# Patient Record
Sex: Male | Born: 1935 | Race: Black or African American | Hispanic: No | Marital: Married | Smoking: Never smoker
Health system: Southern US, Community
[De-identification: ages and names within clinical notes are randomized; demographics above are authoritative.]

## PROBLEM LIST (undated history)

## (undated) DIAGNOSIS — H409 Unspecified glaucoma: Secondary | ICD-10-CM

## (undated) DIAGNOSIS — K219 Gastro-esophageal reflux disease without esophagitis: Secondary | ICD-10-CM

## (undated) DIAGNOSIS — H9190 Unspecified hearing loss, unspecified ear: Secondary | ICD-10-CM

## (undated) DIAGNOSIS — N400 Enlarged prostate without lower urinary tract symptoms: Secondary | ICD-10-CM

## (undated) DIAGNOSIS — I10 Essential (primary) hypertension: Secondary | ICD-10-CM

## (undated) DIAGNOSIS — E559 Vitamin D deficiency, unspecified: Secondary | ICD-10-CM

## (undated) DIAGNOSIS — M1990 Unspecified osteoarthritis, unspecified site: Secondary | ICD-10-CM

## (undated) HISTORY — DX: Unspecified osteoarthritis, unspecified site: M19.90

## (undated) HISTORY — DX: Unspecified hearing loss, unspecified ear: H91.90

## (undated) HISTORY — PX: CATARACT EXTRACTION: SUR2

---

## 2005-08-10 HISTORY — PX: EYE SURGERY: SHX253

## 2007-08-16 ENCOUNTER — Emergency Department (HOSPITAL_COMMUNITY): Admission: EM | Admit: 2007-08-16 | Discharge: 2007-08-16 | Payer: Self-pay | Admitting: Emergency Medicine

## 2007-09-04 ENCOUNTER — Emergency Department (HOSPITAL_COMMUNITY): Admission: EM | Admit: 2007-09-04 | Discharge: 2007-09-05 | Payer: Self-pay | Admitting: Emergency Medicine

## 2007-11-05 ENCOUNTER — Emergency Department (HOSPITAL_COMMUNITY): Admission: EM | Admit: 2007-11-05 | Discharge: 2007-11-05 | Payer: Self-pay | Admitting: Emergency Medicine

## 2007-11-07 ENCOUNTER — Ambulatory Visit: Payer: Self-pay | Admitting: *Deleted

## 2008-01-06 ENCOUNTER — Ambulatory Visit: Payer: Self-pay | Admitting: Internal Medicine

## 2008-01-06 ENCOUNTER — Encounter: Payer: Self-pay | Admitting: Family Medicine

## 2008-01-06 LAB — CONVERTED CEMR LAB
ALT: 14 units/L (ref 0–53)
Alkaline Phosphatase: 81 units/L (ref 39–117)
Basophils Relative: 0 % (ref 0–1)
Eosinophils Absolute: 0.2 10*3/uL (ref 0.0–0.7)
Glucose, Bld: 93 mg/dL (ref 70–99)
LDL Cholesterol: 127 mg/dL — ABNORMAL HIGH (ref 0–99)
MCHC: 33.9 g/dL (ref 30.0–36.0)
MCV: 87 fL (ref 78.0–100.0)
Neutrophils Relative %: 59 % (ref 43–77)
Platelets: 201 10*3/uL (ref 150–400)
Sed Rate: 21 mm/hr — ABNORMAL HIGH (ref 0–16)
Sodium: 142 meq/L (ref 135–145)
Total Bilirubin: 1.1 mg/dL (ref 0.3–1.2)
Total CHOL/HDL Ratio: 4.7
Total Protein: 9.4 g/dL — ABNORMAL HIGH (ref 6.0–8.3)
Triglycerides: 171 mg/dL — ABNORMAL HIGH (ref <150)
VLDL: 34 mg/dL (ref 0–40)

## 2011-04-30 LAB — POCT URINALYSIS DIP (DEVICE)
Hgb urine dipstick: NEGATIVE
Operator id: 235561
Protein, ur: NEGATIVE
Specific Gravity, Urine: 1.02
Urobilinogen, UA: 0.2
pH: 6

## 2011-05-01 LAB — BASIC METABOLIC PANEL
BUN: 25 — ABNORMAL HIGH
Calcium: 8.9
Creatinine, Ser: 1.23
GFR calc non Af Amer: 58 — ABNORMAL LOW
Glucose, Bld: 91
Sodium: 138

## 2011-05-01 LAB — CBC
MCHC: 34.3
Platelets: 197
RDW: 13.9

## 2011-05-01 LAB — DIFFERENTIAL
Basophils Absolute: 0
Basophils Relative: 1
Eosinophils Absolute: 0.2
Neutro Abs: 5.1
Neutrophils Relative %: 53

## 2011-05-04 LAB — DIFFERENTIAL
Basophils Absolute: 0
Eosinophils Relative: 3
Lymphocytes Relative: 36
Monocytes Absolute: 1
Monocytes Relative: 11
Neutro Abs: 4.9

## 2011-05-04 LAB — CBC
MCV: 85.7
Platelets: 211
RDW: 13.4
WBC: 9.7

## 2011-05-04 LAB — POCT CARDIAC MARKERS
CKMB, poc: 1.5
Myoglobin, poc: 75.1
Troponin i, poc: <0.05

## 2011-05-04 LAB — COMPREHENSIVE METABOLIC PANEL
ALT: 21
AST: 20
Albumin: 3.7
CO2: 26
Calcium: 9.1
Chloride: 106
Creatinine, Ser: 1.32
GFR calc non Af Amer: 53 — ABNORMAL LOW
Sodium: 142

## 2011-05-04 LAB — URINALYSIS, ROUTINE W REFLEX MICROSCOPIC
Ketones, ur: NEGATIVE
Protein, ur: NEGATIVE
Urobilinogen, UA: 0.2

## 2016-02-18 ENCOUNTER — Encounter (HOSPITAL_BASED_OUTPATIENT_CLINIC_OR_DEPARTMENT_OTHER): Payer: Self-pay | Admitting: *Deleted

## 2016-02-18 ENCOUNTER — Emergency Department (HOSPITAL_BASED_OUTPATIENT_CLINIC_OR_DEPARTMENT_OTHER)
Admission: EM | Admit: 2016-02-18 | Discharge: 2016-02-18 | Disposition: A | Discharge status: Home / Self Care | Payer: Self-pay | Attending: Emergency Medicine | Admitting: Emergency Medicine

## 2016-02-18 DIAGNOSIS — H409 Unspecified glaucoma: Secondary | ICD-10-CM | POA: Insufficient documentation

## 2016-02-18 DIAGNOSIS — Z79899 Other long term (current) drug therapy: Secondary | ICD-10-CM | POA: Insufficient documentation

## 2016-02-18 HISTORY — DX: Unspecified glaucoma: H40.9

## 2016-02-18 MED ORDER — ACETAMINOPHEN 500 MG PO TABS
1000.0000 mg | ORAL_TABLET | Freq: Once | ORAL | Status: AC
  Administered 2016-02-18: 1000 mg via ORAL
  Filled 2016-02-18: qty 2

## 2016-02-18 MED ORDER — FLUORESCEIN SODIUM 1 MG OP STRP
1.0000 | ORAL_STRIP | Freq: Once | OPHTHALMIC | Status: AC
  Administered 2016-02-18: 1 via OPHTHALMIC
  Filled 2016-02-18: qty 1

## 2016-02-18 MED ORDER — IBUPROFEN 800 MG PO TABS
800.0000 mg | ORAL_TABLET | Freq: Once | ORAL | Status: AC
  Administered 2016-02-18: 800 mg via ORAL
  Filled 2016-02-18: qty 1

## 2016-02-18 MED ORDER — TETRACAINE HCL 0.5 % OP SOLN
2.0000 [drp] | Freq: Once | OPHTHALMIC | Status: AC
  Administered 2016-02-18: 2 [drp] via OPHTHALMIC
  Filled 2016-02-18: qty 4

## 2016-02-18 MED ORDER — OXYCODONE HCL 5 MG PO TABS: 5.0000 mg | ORAL_TABLET | ORAL | Status: DC | PRN

## 2016-02-18 MED ORDER — POLYVINYL ALCOHOL-POVIDONE 5-6 MG/ML OP SOLN: 2.0000 [drp] | Freq: Two times a day (BID) | OPHTHALMIC | Status: DC

## 2016-02-18 MED ORDER — OXYCODONE HCL 5 MG PO TABS
5.0000 mg | ORAL_TABLET | Freq: Once | ORAL | Status: AC
  Administered 2016-02-18: 5 mg via ORAL
  Filled 2016-02-18: qty 1

## 2016-02-18 MED FILL — oxyCODONE HCL 5 MG TABS: 5 | 2 days supply | Qty: 5 | Fill #0

## 2016-02-18 NOTE — ED Notes (Signed)
Fish farm manager phone used

## 2016-02-18 NOTE — ED Provider Notes (Signed)
CSN: [id number]     Arrival date & time 02/18/16  1634 History   First MD Initiated Contact with Patient 02/18/16 1641     Chief Complaint  Patient presents with  . Eye Pain     (Consider location/radiation/quality/duration/timing/severity/associated sxs/prior Treatment) Patient is a 82 y.o. male presenting with eye pain. The history is provided by the patient. The history is limited by a language barrier. A language interpreter was used.  Eye Pain This is a chronic problem. The current episode started more than 1 week ago (about 6 months ago). The problem occurs constantly. The problem has not changed since onset.Pertinent negatives include no chest pain, no abdominal pain, no headaches and no shortness of breath. Nothing aggravates the symptoms. Nothing relieves the symptoms. He has tried nothing for the symptoms. The treatment provided no relief.   82 yo M With a chief complaint of left eye pain. Patient is also not had vision in this eye for at least 6 months. His symptoms have gotten slightly better after he saw an ophthalmologist and was started on atenolol and acetylzolamide and predforte drops.   However over the past 3 weeks or so his symptoms gotten worse. Is now having some much pain that he has trouble sleeping. Patient does not feel that he can see anything out of the left eye. Denies injury. Denies noncompliance.  Past Medical History  Diagnosis Date  . Glaucoma    Past Surgical History  Procedure Laterality Date  . Eye surgery    . Cataract extraction     History reviewed. No pertinent family history. Social History  Substance Use Topics  . Smoking status: Never Smoker   . Smokeless tobacco: None  . Alcohol Use: No    Review of Systems  Constitutional: Negative for fever and chills.  HENT: Negative for congestion and facial swelling.   Eyes: Positive for pain, redness and visual disturbance. Negative for discharge.  Respiratory: Negative for shortness of breath.    Cardiovascular: Negative for chest pain and palpitations.  Gastrointestinal: Negative for vomiting, abdominal pain and diarrhea.  Musculoskeletal: Negative for myalgias and arthralgias.  Skin: Negative for color change and rash.  Neurological: Negative for tremors, syncope and headaches.  Psychiatric/Behavioral: Negative for confusion and dysphoric mood.      Allergies  Review of patient's allergies indicates no known allergies.  Home Medications   Prior to Admission medications   Medication Sig Start Date End Date Taking? Authorizing Provider  brimonidine (ALPHAGAN) 0.2 % ophthalmic solution 3 (three) times daily.   Yes Historical Provider, MD  prednisoLONE acetate (PRED FORTE) 1 % ophthalmic suspension 1 drop 4 (four) times daily.   Yes Historical Provider, MD  timolol (BETIMOL) 0.5 % ophthalmic solution 1 drop 2 (two) times daily.   Yes Historical Provider, MD  Travoprost, BAK Free, (TRAVATAN) 0.004 % SOLN ophthalmic solution 1 drop at bedtime.   Yes Historical Provider, MD  oxyCODONE (ROXICODONE) 5 MG immediate release tablet Take 1 tablet (5 mg total) by mouth every 4 (four) hours as needed for severe pain. 02/18/16   Deno Etienne, DO  Polyvinyl Alcohol-Povidone (CLEAR EYES NATURAL TEARS) 5-6 MG/ML SOLN Apply 2 drops to eye 2 (two) times daily. 02/18/16   Deno Etienne, DO   BP 160/81 mmHg  Pulse 58  Resp 16  Ht 6\' 5"  (1.956 m)  Wt 210 lb (95.255 kg)  BMI 24.90 kg/m2  SpO2 99% Physical Exam  Constitutional: He is oriented to person, place, and time. He appears  well-developed and well-nourished.  HENT:  Head: Normocephalic and atraumatic.  Eyes: EOM are normal. Pupils are equal, round, and reactive to light. Left eye exhibits no chemosis, no discharge and no exudate. Left conjunctiva is injected.  Hazy appearing cornea with nonreactive pupil. Pupil is mid fixed and dilated.  tonopen pressure 54 and repeat 53.   Neck: Normal range of motion. Neck supple. No JVD present.   Cardiovascular: Normal rate and regular rhythm.  Exam reveals no gallop and no friction rub.   No murmur heard. Pulmonary/Chest: No respiratory distress. He has no wheezes.  Abdominal: He exhibits no distension. There is no rebound and no guarding.  Musculoskeletal: Normal range of motion.  Neurological: He is alert and oriented to person, place, and time.  Skin: No rash noted. No pallor.  Psychiatric: He has a normal mood and affect. His behavior is normal.  Nursing note and vitals reviewed.   ED Course  Procedures (including critical care time) Labs Review Labs Reviewed - No data to display  Imaging Review No results found. I have personally reviewed and evaluated these images and lab results as part of my medical decision-making.   EKG Interpretation None      MDM   Final diagnoses:  Glaucoma    82 yo M with a chief complaint of left eye pain. Patient has a complicated ophthalmologic history. Discussed with the ophthalmologist on-call, Dr. Arlina Robes,  she recommended continuing his current drops and she will see him in the office in the morning. With the chronicity of pain feel unlikely to be acute angle closure glaucoma.   6:46 PM:  I have discussed the diagnosis/risks/treatment options with the patient and family and believe the pt to be eligible for discharge home to follow-up with Ophto. We also discussed returning to the ED immediately if new or worsening sx occur. We discussed the sx which are most concerning (e.g., sudden worsening pain, fever, inability to tolerate by mouth) that necessitate immediate return. Medications administered to the patient during their visit and any new prescriptions provided to the patient are listed below.  Medications given during this visit Medications  fluorescein ophthalmic strip 1 strip (1 strip Both Eyes Given 02/18/16 1656)  tetracaine (PONTOCAINE) 0.5 % ophthalmic solution 2 drop (2 drops Both Eyes Given 02/18/16 1656)  acetaminophen  (TYLENOL) tablet 1,000 mg (1,000 mg Oral Given 02/18/16 1719)  ibuprofen (ADVIL,MOTRIN) tablet 800 mg (800 mg Oral Given 02/18/16 1719)  oxyCODONE (Oxy IR/ROXICODONE) immediate release tablet 5 mg (5 mg Oral Given 02/18/16 1719)    Discharge Medication List as of 02/18/2016  5:29 PM    START taking these medications   Details  oxyCODONE (ROXICODONE) 5 MG immediate release tablet Take 1 tablet (5 mg total) by mouth every 4 (four) hours as needed for severe pain., Starting 02/18/2016, Until Discontinued, Print    Polyvinyl Alcohol-Povidone (CLEAR EYES NATURAL TEARS) 5-6 MG/ML SOLN Apply 2 drops to eye 2 (two) times daily., Starting 02/18/2016, Until Discontinued, Print        The patient appears reasonably screen and/or stabilized for discharge and I doubt any other medical condition or other [facility] requiring further screening, evaluation, or treatment in the ED at this time prior to discharge.    Deno Etienne, DO 02/18/16 [phone]

## 2016-02-18 NOTE — ED Notes (Signed)
MD at bedside. 

## 2016-02-18 NOTE — ED Notes (Signed)
Pt c/o left eye pain and no vision x 3 weeks

## 2016-02-18 NOTE — Discharge Instructions (Signed)
See the eye doctor in the morning! Glaucoma Glaucoma is a condition that is caused by high pressure inside the eye. The pressure in the eye is raised because fluid (aqueous humor) within the eye cannot get out through the normal drainage system. The increased pressure can cause damage to the nerves of the eye, and that can result in loss of vision. It is important to diagnose glaucoma early before damage occurs. Early treatment can often prevent vision loss. There are two main types of glaucoma:  Open-angle glaucoma. This is a chronic condition that causes the pressure inside the eye to rise slowly. This is the most common type of glaucoma, and it often causes no symptoms until later in the disease.  Acute angle-closure glaucoma. With this type, the pressure inside the eye rises suddenly to a very high level. This causes severe pain and requires treatment right away. CAUSES In many cases, the cause of this condition is not known. Glaucoma can sometimes result from other diseases, such as infection, cataracts, or tumors.  RISK FACTORS This condition is more likely to develop in people who:  Are older than 80 years of age.  Are of African-American descent.  Have high blood pressure or diabetes.  Have a family history of glaucoma.  Have a previous eye injury or eye surgery.  Are farsighted.  Use certain medicines that increase pressure in the eyes or cause the pupils to widen (dilate). SYMPTOMS In many cases, open-angle glaucoma causes no symptoms early on. If it is not treated, the condition will get worse and may cause a loss of side vision (peripheral vision). This can advance to tunnel vision, which means that you are able to see straight ahead but you have a loss of peripheral vision in all directions. Eventually, total loss of vision can occur. Symptoms of acute angle-closure glaucoma develop suddenly and may include:  Clouded vision.  Severe pain in your affected eye.  Severe  headache in the area around your eye.  Feeling nauseous.  Vomiting. DIAGNOSIS Glaucoma is diagnosed through an eye exam by an eye specialist (ophthalmologist). This specialist will perform a test to measure the pressure in your eye (tonometry). Eye tests to check your vision-- especially your peripheral vision--will be done. The ophthalmologist will also use various instruments to look inside your eye and check for damage to the nerves. Because glaucoma often does not cause symptoms until late in the disease, it is often found during a routine eye exam. It is important to have your eyes checked regularly. TREATMENT Early treatment is important to prevent vision loss. Treatment will focus on lowering the pressure in your eyes. This usually involves using medicines in the form of eye drops. The type of medicine will depend on how bad the disease is and the degree of damage. Laser treatments or other types of surgery may be needed in some cases.  Acute angle-closure glaucoma requires emergency treatment to help prevent vision loss. This treatment may involve eye drops and medicines that are given in pill form or through an IV tube. Surgery is usually done on both eyes right away. HOME CARE INSTRUCTIONS  Take medicines only as directed by your health care provider. Use your eye drops exactly as instructed.It is likely that you will need to use this medicine for the rest of your life.  Get regular exercise, but talk with your health care provider about which types of exercise are safe for you. You may need to avoid yoga or other types  of exercise that may involve standing on your head.  Keep all follow-up visits as directed by your health care provider. This is important. SEEK MEDICAL CARE IF:  You have new or worsening symptoms. SEEK IMMEDIATE MEDICAL CARE IF:  You have severe pain in your affected eye.  You have problems with your vision.  You have a bad headache in the area around your  eye.  You develop nausea and vomiting.  You have the same or similar symptoms in your other eye.   This information is not intended to replace advice given to you by your health care provider. Make sure you discuss any questions you have with your health care provider.   Document Released: 07/27/2005 Document Revised: 08/17/2014 Document Reviewed: 05/08/2014 Elsevier Interactive Patient Education [employer].

## 2016-04-02 ENCOUNTER — Ambulatory Visit (HOSPITAL_COMMUNITY)
Admission: EM | Admit: 2016-04-02 | Discharge: 2016-04-02 | Disposition: A | Discharge status: Home / Self Care | Payer: Self-pay | Attending: Emergency Medicine | Admitting: Emergency Medicine

## 2016-04-02 ENCOUNTER — Encounter (HOSPITAL_COMMUNITY): Payer: Self-pay | Admitting: Emergency Medicine

## 2016-04-02 DIAGNOSIS — H6123 Impacted cerumen, bilateral: Secondary | ICD-10-CM

## 2016-04-02 NOTE — ED Triage Notes (Signed)
Via pt's daughter who's serving as Arabic interpreter  Pt reports recurrent of bilateral ear cerumen impaction.   Voices no other concerns.... A&O x4... NAD

## 2016-04-02 NOTE — ED Provider Notes (Signed)
HPI  SUBJECTIVE:  James Ayers is a 82 y.o. male who presents with decreased hearing in both ears. This is been going on for an unknown period of time. Daughter states that the saw an audiologist/hearing a specialist earlier today, the patient was thought to have bilateral cerumen impaction. States that they were unable to perform the audiology test due to this. They were then referred here. They have tried irrigation and Debrox at home. There are no other aggravating or alleviating factors. He denies nausea, vomiting, fevers, ear pain, otorrhea. No foreign body insertion. Patient wears hearing aids. Daughter states that the patient requires cerumen disimpaction every 2-3 months. Patient has moved here permanently recently. PMD: [facility]- cannot remember MD's name. History obtained through daughter acting as Optometrist.     Past Medical History:  Diagnosis Date  . Glaucoma     Past Surgical History:  Procedure Laterality Date  . CATARACT EXTRACTION    . EYE SURGERY      History reviewed. No pertinent family history.  Social History  Substance Use Topics  . Smoking status: Never Smoker  . Smokeless tobacco: Never Used  . Alcohol use No    No current facility-administered medications for this encounter.   Current Outpatient Prescriptions:  .  brimonidine (ALPHAGAN) 0.2 % ophthalmic solution, 3 (three) times daily., Disp: , Rfl:  .  oxyCODONE (ROXICODONE) 5 MG immediate release tablet, Take 1 tablet (5 mg total) by mouth every 4 (four) hours as needed for severe pain., Disp: 5 tablet, Rfl: 0 .  Polyvinyl Alcohol-Povidone (CLEAR EYES NATURAL TEARS) 5-6 MG/ML SOLN, Apply 2 drops to eye 2 (two) times daily., Disp: 30 mL, Rfl: 0 .  prednisoLONE acetate (PRED FORTE) 1 % ophthalmic suspension, 1 drop 4 (four) times daily., Disp: , Rfl:  .  timolol (BETIMOL) 0.5 % ophthalmic solution, 1 drop 2 (two) times daily., Disp: , Rfl:  .  Travoprost, BAK Free, (TRAVATAN) 0.004 % SOLN  ophthalmic solution, 1 drop at bedtime., Disp: , Rfl:   No Known Allergies   ROS  As noted in HPI.   Physical Exam  BP 162/73 (BP Location: Left Arm)   Pulse 68   Temp 98.6 F (37 C) (Oral)   Resp 18   SpO2 100%   BP Readings from Last 3 Encounters:  04/02/16 162/73  02/18/16 160/81   Constitutional: Well developed, well nourished, no acute distress Eyes:  EOMI, conjunctiva normal bilaterally HENT: Normocephalic, atraumatic,mucus membranes moist. Right ear completely occluded with cerumen. Mild cerumen buildup in the left ear, but able to visualize TM, which appears normal. Hearing grossly diminished bilaterally. Respiratory: Normal inspiratory effort Cardiovascular: Normal rate GI: nondistended skin: No rash, skin intact Musculoskeletal: no deformities Neurologic: Alert & oriented x 3, no focal neuro deficits Psychiatric: Speech and behavior appropriate   ED Course   Medications - No data to display  Orders Placed This Encounter  Procedures  . Ear wax removal    Standing Status:   Standing    Number of Occurrences:   1    No results found for this or any previous visit (from the past 24 hour(s)). No results found.  ED Clinical Impression  Cerumen impaction, bilateral   ED Assessment/Plan  On reevaluation, bilateral TMs normal. Bilateral external ear canals normal. No Evidence of trauma to TM, otitis media, or otitis externa. No pain with traction on the right pinna. Hearing still diminished, but improved per pt. Hearing grossly equal bilaterally.  Advised follow-up with  audiologist. If hearing still diminished, we'll refer to Dr. Lucia Gaskins, ENT on call.   Discussed MDM, plan and followup with patient  Discussed sn/sx that should prompt return to the ED. family agrees with plan.   *This clinic note was created using Dragon dictation software. Therefore, there may be occasional mistakes despite careful proofreading.  ?   Melynda Ripple, MD 04/02/16  2135

## 2016-04-02 NOTE — Discharge Instructions (Signed)
Try some Debrox to help prevent wax buildup in the future.

## 2016-06-18 ENCOUNTER — Emergency Department (HOSPITAL_COMMUNITY): Payer: Self-pay

## 2016-06-18 ENCOUNTER — Encounter (HOSPITAL_COMMUNITY): Payer: Self-pay

## 2016-06-18 ENCOUNTER — Emergency Department (HOSPITAL_COMMUNITY)
Admission: EM | Admit: 2016-06-18 | Discharge: 2016-06-18 | Disposition: A | Discharge status: Home / Self Care | Payer: Self-pay | Attending: Emergency Medicine | Admitting: Emergency Medicine

## 2016-06-18 DIAGNOSIS — M545 Low back pain: Secondary | ICD-10-CM | POA: Insufficient documentation

## 2016-06-18 DIAGNOSIS — Y939 Activity, unspecified: Secondary | ICD-10-CM | POA: Insufficient documentation

## 2016-06-18 DIAGNOSIS — Z7982 Long term (current) use of aspirin: Secondary | ICD-10-CM | POA: Insufficient documentation

## 2016-06-18 DIAGNOSIS — R102 Pelvic and perineal pain: Secondary | ICD-10-CM | POA: Insufficient documentation

## 2016-06-18 DIAGNOSIS — W010XXA Fall on same level from slipping, tripping and stumbling without subsequent striking against object, initial encounter: Secondary | ICD-10-CM | POA: Insufficient documentation

## 2016-06-18 DIAGNOSIS — Y999 Unspecified external cause status: Secondary | ICD-10-CM | POA: Insufficient documentation

## 2016-06-18 DIAGNOSIS — W19XXXA Unspecified fall, initial encounter: Secondary | ICD-10-CM

## 2016-06-18 DIAGNOSIS — Y9289 Other specified places as the place of occurrence of the external cause: Secondary | ICD-10-CM | POA: Insufficient documentation

## 2016-06-18 MED ORDER — OXYCODONE HCL 5 MG PO TABS: 5.0000 mg | ORAL_TABLET | Freq: Four times a day (QID) | ORAL | 0 refills | Status: DC | PRN

## 2016-06-18 MED ORDER — METHOCARBAMOL 500 MG PO TABS
500.0000 mg | ORAL_TABLET | Freq: Once | ORAL | Status: AC
  Administered 2016-06-18: 500 mg via ORAL
  Filled 2016-06-18: qty 1

## 2016-06-18 MED ORDER — IBUPROFEN 400 MG PO TABS
400.0000 mg | ORAL_TABLET | Freq: Once | ORAL | Status: AC
  Administered 2016-06-18: 400 mg via ORAL
  Filled 2016-06-18: qty 1

## 2016-06-18 MED ORDER — METHOCARBAMOL 500 MG PO TABS: 500.0000 mg | ORAL_TABLET | Freq: Two times a day (BID) | ORAL | 0 refills | Status: DC | PRN

## 2016-06-18 MED ORDER — IBUPROFEN 400 MG PO TABS: 400.0000 mg | ORAL_TABLET | Freq: Four times a day (QID) | ORAL | 0 refills | Status: DC | PRN

## 2016-06-18 NOTE — ED Triage Notes (Signed)
Pt slipped and fell yesterday while outside. Pt reports pain to lower back and hips. Denies head injury because his son states he held his head. Denies LOC.

## 2016-06-20 NOTE — ED Provider Notes (Signed)
[facility] DEPT Provider Note   CSN: [id number] Arrival date & time: 06/18/16  1544     History   Chief Complaint Chief Complaint  Patient presents with  . Fall    HPI Dvonta Caltrider is a 82 y.o. male.   Fall  This is a new problem. The current episode started yesterday. The problem occurs rarely. The problem has been resolved. The symptoms are relieved by NSAIDs.    Past Medical History:  Diagnosis Date  . Glaucoma     There are no active problems to display for this patient.   Past Surgical History:  Procedure Laterality Date  . CATARACT EXTRACTION    . EYE SURGERY         Home Medications    Prior to Admission medications   Medication Sig Start Date End Date Taking? Authorizing Provider  aspirin 81 MG chewable tablet Chew 81 mg by mouth daily.   Yes Historical Provider, MD  brimonidine (ALPHAGAN) 0.2 % ophthalmic solution Place 1 drop into both eyes 3 (three) times daily.    Yes Historical Provider, MD  Polyvinyl Alcohol-Povidone (CLEAR EYES NATURAL TEARS) 5-6 MG/ML SOLN Apply 2 drops to eye 2 (two) times daily. Patient taking differently: Place 2 drops into both eyes 2 (two) times daily.  02/18/16  Yes Deno Etienne, DO  prednisoLONE acetate (PRED FORTE) 1 % ophthalmic suspension Place 1 drop into both eyes 4 (four) times daily.    Yes Historical Provider, MD  timolol (BETIMOL) 0.5 % ophthalmic solution Place 1 drop into both eyes 2 (two) times daily.    Yes Historical Provider, MD  Travoprost, BAK Free, (TRAVATAN) 0.004 % SOLN ophthalmic solution Place 1 drop into both eyes at bedtime.    Yes Historical Provider, MD  ibuprofen (ADVIL,MOTRIN) 400 MG tablet Take 1 tablet (400 mg total) by mouth every 6 (six) hours as needed. 06/18/16   Merrily Pew, MD  methocarbamol (ROBAXIN) 500 MG tablet Take 1 tablet (500 mg total) by mouth 2 (two) times daily as needed for muscle spasms. 06/18/16   Merrily Pew, MD  oxyCODONE (ROXICODONE) 5 MG immediate release tablet Take 1  tablet (5 mg total) by mouth every 6 (six) hours as needed for severe pain. 06/18/16   Merrily Pew, MD    Family History No family history on file.  Social History Social History  Substance Use Topics  . Smoking status: Never Smoker  . Smokeless tobacco: Never Used  . Alcohol use No     Allergies   Patient has no known allergies.   Review of Systems Review of Systems  Musculoskeletal: Positive for back pain. Negative for gait problem and neck pain.       Left sided pelvis pain mostly.   Skin: Negative for color change.  All other systems reviewed and are negative.    Physical Exam Updated Vital Signs BP 160/63 (BP Location: Right Arm)   Pulse 63   Temp 98 F (36.7 C)   Resp 16   SpO2 100%   Physical Exam  Constitutional: He is oriented to person, place, and time. He appears well-developed and well-nourished.  HENT:  Head: Normocephalic and atraumatic.  Eyes: Conjunctivae and EOM are normal.  Neck: Normal range of motion. No thyromegaly present.  Cardiovascular: Normal rate.   Pulmonary/Chest: Effort normal. No respiratory distress.  Abdominal: Soft. He exhibits no distension.  Musculoskeletal: Normal range of motion. He exhibits tenderness.  No cervical spine tenderness, thoracic spine tenderness or Lumbar spine tenderness.  No tenderness or pain with palpation and full ROM of all joints in upper and lower extremities.  No ecchymosis or other signs of trauma on back or extremities but some point ttp over left pelvis area.  No Pain with AP or lateral compression of ribs.  No Paracervical ttp, but does have mild lumbar paraspinal ttp However is able to ambulate without difficulty  Neurological: He is alert and oriented to person, place, and time.  Skin: Skin is warm and dry.  Nursing note and vitals reviewed.    ED Treatments / Results  Labs (all labs ordered are listed, but only abnormal results are displayed) Labs Reviewed - No data to display  EKG   EKG Interpretation None       Radiology Dg Thoracic Spine 2 View  Result Date: 06/18/2016 CLINICAL DATA:  Fall down steps yesterday. Thoracic back pain. Initial encounter. EXAM: THORACIC SPINE 2 VIEWS COMPARISON:  None. FINDINGS: There is no evidence of thoracic spine fracture. Alignment is normal. Mild thoracic vertebral osteophytosis is seen at all levels, without significant disc space narrowing. IMPRESSION: No acute findings. Electronically Signed   By: Earle Gell M.D.   On: 06/18/2016 17:17   Dg Lumbar Spine Complete  Result Date: 06/18/2016 CLINICAL DATA:  Fall down steps yesterday. Low back injury and pain. Initial encounter. EXAM: LUMBAR SPINE - COMPLETE 4+ VIEW COMPARISON:  None. FINDINGS: There is no evidence of lumbar spine fracture. Alignment is normal. Intervertebral disc spaces are maintained. Syndesmophyte formation is seen throughout the thoracic spine. No other significant osseous abnormality identified. IMPRESSION: No acute findings. Electronically Signed   By: Earle Gell M.D.   On: 06/18/2016 17:17   Dg Pelvis 1-2 Views  Result Date: 06/18/2016 CLINICAL DATA:  Fall down steps yesterday. Pelvic and buttock pain. Initial encounter. EXAM: PELVIS - 1-2 VIEW COMPARISON:  None. FINDINGS: There is no evidence of pelvic fracture or diastasis. No pelvic bone lesions are seen. Pelvic phleboliths and iliac vascular calcification incidentally noted. IMPRESSION: Negative. Electronically Signed   By: Earle Gell M.D.   On: 06/18/2016 17:15    Procedures Procedures (including critical care time)  Medications Ordered in ED Medications  methocarbamol (ROBAXIN) tablet 500 mg (500 mg Oral Given 06/18/16 1756)  ibuprofen (ADVIL,MOTRIN) tablet 400 mg (400 mg Oral Given 06/18/16 1756)     Initial Impression / Assessment and Plan / ED Course  I have reviewed the triage vital signs and the nursing notes.  Pertinent labs & imaging results that were available during my care of the patient  were reviewed by me and considered in my medical decision making (see chart for details).  Clinical Course     Low suspicion for a bony injury at this time. Likely muscular in nature and will treat as such. If symptoms not improving in a couple days we need to follow up with his primary doctor for further work up.  Final Clinical Impressions(s) / ED Diagnoses   Final diagnoses:  Fall, initial encounter  Pain in pelvis    New Prescriptions Discharge Medication List as of 06/18/2016  5:54 PM    START taking these medications   Details  ibuprofen (ADVIL,MOTRIN) 400 MG tablet Take 1 tablet (400 mg total) by mouth every 6 (six) hours as needed., Starting Thu 06/18/2016, Print    methocarbamol (ROBAXIN) 500 MG tablet Take 1 tablet (500 mg total) by mouth 2 (two) times daily as needed for muscle spasms., Starting Thu 06/18/2016, Print  Merrily Pew, MD 06/20/16 1043

## 2016-08-25 ENCOUNTER — Encounter (INDEPENDENT_AMBULATORY_CARE_PROVIDER_SITE_OTHER): Payer: Self-pay

## 2016-08-25 ENCOUNTER — Encounter: Payer: Self-pay | Admitting: Internal Medicine

## 2016-08-25 ENCOUNTER — Ambulatory Visit: Payer: Self-pay | Attending: Internal Medicine | Admitting: Internal Medicine

## 2016-08-25 VITALS — BP 109/67 | HR 65 | Temp 97.8°F | Resp 16 | Wt 192.2 lb

## 2016-08-25 DIAGNOSIS — E559 Vitamin D deficiency, unspecified: Secondary | ICD-10-CM | POA: Insufficient documentation

## 2016-08-25 DIAGNOSIS — I10 Essential (primary) hypertension: Secondary | ICD-10-CM | POA: Insufficient documentation

## 2016-08-25 DIAGNOSIS — N401 Enlarged prostate with lower urinary tract symptoms: Secondary | ICD-10-CM | POA: Insufficient documentation

## 2016-08-25 DIAGNOSIS — Z23 Encounter for immunization: Secondary | ICD-10-CM

## 2016-08-25 DIAGNOSIS — G8929 Other chronic pain: Secondary | ICD-10-CM | POA: Insufficient documentation

## 2016-08-25 DIAGNOSIS — R42 Dizziness and giddiness: Secondary | ICD-10-CM | POA: Insufficient documentation

## 2016-08-25 DIAGNOSIS — Z131 Encounter for screening for diabetes mellitus: Secondary | ICD-10-CM

## 2016-08-25 DIAGNOSIS — M545 Low back pain, unspecified: Secondary | ICD-10-CM | POA: Insufficient documentation

## 2016-08-25 DIAGNOSIS — Z1329 Encounter for screening for other suspected endocrine disorder: Secondary | ICD-10-CM

## 2016-08-25 LAB — CBC
HCT: 44.4 % (ref 38.5–50.0)
HEMOGLOBIN: 15 g/dL (ref 13.2–17.1)
MCH: 30.1 pg (ref 27.0–33.0)
MCHC: 33.8 g/dL (ref 32.0–36.0)
MCV: 89 fL (ref 80.0–100.0)
MPV: 9.9 fL (ref 7.5–12.5)
Platelets: 220 10*3/uL (ref 140–400)
RBC: 4.99 MIL/uL (ref 4.20–5.80)
RDW: 14.4 % (ref 11.0–15.0)
WBC: 9.2 10*3/uL (ref 3.8–10.8)

## 2016-08-25 LAB — POCT URINALYSIS DIPSTICK
Blood, UA: NEGATIVE
Glucose, UA: NEGATIVE
LEUKOCYTES UA: NEGATIVE
NITRITE UA: NEGATIVE
PROTEIN UA: 100
Spec Grav, UA: 1.025
Urobilinogen, UA: 0.2
pH, UA: 5.5

## 2016-08-25 LAB — TSH: TSH: 0.04 m[IU]/L — AB (ref 0.40–4.50)

## 2016-08-25 LAB — POCT GLYCOSYLATED HEMOGLOBIN (HGB A1C): Hemoglobin A1C: 5.7

## 2016-08-25 NOTE — Patient Instructions (Addendum)
Health Maintenance, Male A healthy lifestyle and preventative care can promote health and wellness.  Maintain regular health, dental, and eye exams.  Eat a healthy diet. Foods like vegetables, fruits, whole grains, low-fat dairy products, and lean protein foods contain the nutrients you need and are low in calories. Decrease your intake of foods high in solid fats, added sugars, and salt. Get information about a proper diet from your health care provider, if necessary.  Regular physical exercise is one of the most important things you can do for your health. Most adults should get at least 150 minutes of moderate-intensity exercise (any activity that increases your heart rate and causes you to sweat) each week. In addition, most adults need muscle-strengthening exercises on 2 or more days a week.   Maintain a healthy weight. The body mass index (BMI) is a screening tool to identify possible weight problems. It provides an estimate of body fat based on height and weight. Your health care provider can find your BMI and can help you achieve or maintain a healthy weight. For males 20 years and older:  A BMI below 18.5 is considered underweight.  A BMI of 18.5 to 24.9 is normal.  A BMI of 25 to 29.9 is considered overweight.  A BMI of 30 and above is considered obese.  Maintain normal blood lipids and cholesterol by exercising and minimizing your intake of saturated fat. Eat a balanced diet with plenty of fruits and vegetables. Blood tests for lipids and cholesterol should begin at age 20 and be repeated every 5 years. If your lipid or cholesterol levels are high, you are over age 50, or you are at high risk for heart disease, you may need your cholesterol levels checked more frequently.Ongoing high lipid and cholesterol levels should be treated with medicines if diet and exercise are not working.  If you smoke, find out from your health care provider how to quit. If you do not use tobacco, do not  start.  Lung cancer screening is recommended for adults aged 55-80 years who are at high risk for developing lung cancer because of a history of smoking. A yearly low-dose CT scan of the lungs is recommended for people who have at least a 30-pack-year history of smoking and are current smokers or have quit within the past 15 years. A pack year of smoking is smoking an average of 1 pack of cigarettes a day for 1 year (for example, a 30-pack-year history of smoking could mean smoking 1 pack a day for 30 years or 2 packs a day for 15 years). Yearly screening should continue until the smoker has stopped smoking for at least 15 years. Yearly screening should be stopped for people who develop a health problem that would prevent them from having lung cancer treatment.  If you choose to drink alcohol, do not have more than 2 drinks per day. One drink is considered to be 12 oz (360 mL) of beer, 5 oz (150 mL) of wine, or 1.5 oz (45 mL) of liquor.  Avoid the use of street drugs. Do not share needles with anyone. Ask for help if you need support or instructions about stopping the use of drugs.  High blood pressure causes heart disease and increases the risk of stroke. High blood pressure is more likely to develop in:  People who have blood pressure in the end of the normal range (100-139/85-89 mm Hg).  People who are overweight or obese.  People who are African American.    If you are 18-39 years of age, have your blood pressure checked every 3-5 years. If you are 40 years of age or older, have your blood pressure checked every year. You should have your blood pressure measured twice-once when you are at a hospital or clinic, and once when you are not at a hospital or clinic. Record the average of the two measurements. To check your blood pressure when you are not at a hospital or clinic, you can use:  An automated blood pressure machine at a pharmacy.  A home blood pressure monitor.  If you are 45-79 years  old, ask your health care provider if you should take aspirin to prevent heart disease.  Diabetes screening involves taking a blood sample to check your fasting blood sugar level. This should be done once every 3 years after age 45 if you are at a normal weight and without risk factors for diabetes. Testing should be considered at a younger age or be carried out more frequently if you are overweight and have at least 1 risk factor for diabetes.  Colorectal cancer can be detected and often prevented. Most routine colorectal cancer screening begins at the age of 50 and continues through age 75. However, your health care provider may recommend screening at an earlier age if you have risk factors for colon cancer. On a yearly basis, your health care provider may provide home test kits to check for hidden blood in the stool. A small camera at the end of a tube may be used to directly examine the colon (sigmoidoscopy or colonoscopy) to detect the earliest forms of colorectal cancer. Talk to your health care provider about this at age 50 when routine screening begins. A direct exam of the colon should be repeated every 5-10 years through age 75, unless early forms of precancerous polyps or small growths are found.  People who are at an increased risk for hepatitis B should be screened for this virus. You are considered at high risk for hepatitis B if:  You were born in a country where hepatitis B occurs often. Talk with your health care provider about which countries are considered high risk.  Your parents were born in a high-risk country and you have not received a shot to protect against hepatitis B (hepatitis B vaccine).  You have HIV or AIDS.  You use needles to inject street drugs.  You live with, or have sex with, someone who has hepatitis B.  You are a man who has sex with other men (MSM).  You get hemodialysis treatment.  You take certain medicines for conditions like cancer, organ  transplantation, and autoimmune conditions.  Hepatitis C blood testing is recommended for all people born from 1945 through 1965 and any individual with known risk factors for hepatitis C.  Healthy men should no longer receive prostate-specific antigen (PSA) blood tests as part of routine cancer screening. Talk to your health care provider about prostate cancer screening.  Testicular cancer screening is not recommended for adolescents or adult males who have no symptoms. Screening includes self-exam, a health care provider exam, and other screening tests. Consult with your health care provider about any symptoms you have or any concerns you have about testicular cancer.  Practice safe sex. Use condoms and avoid high-risk sexual practices to reduce the spread of sexually transmitted infections (STIs).  You should be screened for STIs, including gonorrhea and chlamydia if:  You are sexually active and are younger than 24 years.  You   are older than 24 years, and your health care provider tells you that you are at risk for this type of infection.  Your sexual activity has changed since you were last screened, and you are at an increased risk for chlamydia or gonorrhea. Ask your health care provider if you are at risk.  If you are at risk of being infected with HIV, it is recommended that you take a prescription medicine daily to prevent HIV infection. This is called pre-exposure prophylaxis (PrEP). You are considered at risk if:  You are a man who has sex with other men (MSM).  You are a heterosexual man who is sexually active with multiple partners.  You take drugs by injection.  You are sexually active with a partner who has HIV.  Talk with your health care provider about whether you are at high risk of being infected with HIV. If you choose to begin PrEP, you should first be tested for HIV. You should then be tested every 3 months for as long as you are taking PrEP.  Use sunscreen. Apply  sunscreen liberally and repeatedly throughout the day. You should seek shade when your shadow is shorter than you. Protect yourself by wearing long sleeves, pants, a wide-brimmed hat, and sunglasses year round whenever you are outdoors.  Tell your health care provider of new moles or changes in moles, especially if there is a change in shape or color. Also, tell your health care provider if a mole is larger than the size of a pencil eraser.  A one-time screening for abdominal aortic aneurysm (AAA) and surgical repair of large AAAs by ultrasound is recommended for men aged 36-75 years who are current or former smokers.  Stay current with your vaccines (immunizations). This information is not intended to replace advice given to you by your health care provider. Make sure you discuss any questions you have with your health care provider. Document Released: 01/23/2008 Document Revised: 08/17/2014 Document Reviewed: 04/30/2015 Elsevier Interactive Patient Education  2017 Elsevier Inc.  -  Edema Edema is an abnormal buildup of fluids. It is more common in your legs and thighs. Painless swelling of the feet and ankles is more likely as a person ages. It also is common in looser skin, like around your eyes. Follow these instructions at home:  Keep the affected body part above the level of the heart while lying down.  Do not sit still or stand for a long time.  Do not put anything right under your knees when you lie down.  Do not wear tight clothes on your upper legs.  Exercise your legs to help the puffiness (swelling) go down.  Wear elastic bandages or support stockings as told by your doctor.  A low-salt diet may help lessen the puffiness.  Only take medicine as told by your doctor. Contact a doctor if:  Treatment is not working.  You have heart, liver, or kidney disease and notice that your skin looks puffy or shiny.  You have puffiness in your legs that does not get better when you  raise your legs.  You have sudden weight gain for no reason. Get help right away if:  You have shortness of breath or chest pain.  You cannot breathe when you lie down.  You have pain, redness, or warmth in the areas that are puffy.  You have heart, liver, or kidney disease and get edema all of a sudden.  You have a fever and your symptoms get worse all  of a sudden. This information is not intended to replace advice given to you by your health care provider. Make sure you discuss any questions you have with your health care provider. Document Released: 01/13/2008 Document Revised: 01/02/2016 Document Reviewed: 05/19/2013 Elsevier Interactive Patient Education  2017 [place]. Pneumococcal Polysaccharide Vaccine: What You Need to Know 1. Why get vaccinated? Vaccination can protect older adults (and some children and younger adults) from pneumococcal disease. Pneumococcal disease is caused by bacteria that can spread from person to person through close contact. It can cause ear infections, and it can also lead to more serious infections of the:  Lungs (pneumonia),  Blood (bacteremia), and  Covering of the brain and spinal cord (meningitis). Meningitis can cause deafness and brain damage, and it can be fatal. Anyone can get pneumococcal disease, but children under 37 years of age, people with certain medical conditions, adults over 34 years of age, and cigarette smokers are at the highest risk. About 18,000 older adults die each year from pneumococcal disease in the [place]. Treatment of pneumococcal infections with penicillin and other drugs used to be more effective. But some strains of the disease have become resistant to these drugs. This makes prevention of the disease, through vaccination, even more important. 2. Pneumococcal polysaccharide vaccine (PPSV23) Pneumococcal polysaccharide vaccine (PPSV23) protects against 23 types of pneumococcal bacteria. It will not prevent  all pneumococcal disease. PPSV23 is recommended for:  All adults 73 years of age and older,  Anyone 2 through 81 years of age with certain long-term health problems,  Anyone 2 through 81 years of age with a weakened immune system,  Adults 47 through 81 years of age who smoke cigarettes or have asthma. Most people need only one dose of PPSV. A second dose is recommended for certain high-risk groups. People 54 and older should get a dose even if they have gotten one or more doses of the vaccine before they turned 65. Your healthcare provider can give you more information about these recommendations. Most healthy adults develop protection within 2 to 3 weeks of getting the shot. 3. Some people should not get this vaccine  Anyone who has had a life-threatening allergic reaction to PPSV should not get another dose.  Anyone who has a severe allergy to any component of PPSV should not receive it. Tell your provider if you have any severe allergies.  Anyone who is moderately or severely ill when the shot is scheduled may be asked to wait until they recover before getting the vaccine. Someone with a mild illness can usually be vaccinated.  Children less than 66 years of age should not receive this vaccine.  There is no evidence that PPSV is harmful to either a pregnant woman or to her fetus. However, as a precaution, women who need the vaccine should be vaccinated before becoming pregnant, if possible. 4. Risks of a vaccine reaction With any medicine, including vaccines, there is a chance of side effects. These are usually mild and go away on their own, but serious reactions are also possible. About half of people who get PPSV have mild side effects, such as redness or pain where the shot is given, which go away within about two days. Less than 1 out of 100 people develop a fever, muscle aches, or more severe local reactions. Problems that could happen after any vaccine:  People sometimes faint  after a medical procedure, including vaccination. Sitting or lying down for about 15 minutes can help prevent fainting,  and injuries caused by a fall. Tell your doctor if you feel dizzy, or have vision changes or ringing in the ears.  Some people get severe pain in the shoulder and have difficulty moving the arm where a shot was given. This happens very rarely.  Any medication can cause a severe allergic reaction. Such reactions from a vaccine are very rare, estimated at about 1 in a million doses, and would happen within a few minutes to a few hours after the vaccination. As with any medicine, there is a very remote chance of a vaccine causing a serious injury or death. The safety of vaccines is always being monitored. For more information, visit: http://www.aguilar.org/ 5. What if there is a serious reaction? What should I look for? Look for anything that concerns you, such as signs of a severe allergic reaction, very high fever, or unusual behavior. Signs of a severe allergic reaction can include hives, swelling of the face and throat, difficulty breathing, a fast heartbeat, dizziness, and weakness. These would usually start a few minutes to a few hours after the vaccination. What should I do? If you think it is a severe allergic reaction or other emergency that can't wait, call 9-1-1 or get to the nearest hospital. Otherwise, call your doctor. Afterward, the reaction should be reported to the Vaccine Adverse Event Reporting System (VAERS). Your doctor might file this report, or you can do it yourself through the VAERS web site at www.vaers.SamedayNews.es, or by calling (239)668-5065. VAERS does not give medical advice. 6. How can I learn more?  Ask your doctor. He or she can give you the vaccine package insert or suggest other sources of information.  Call your local or state health department.  Contact the Centers for Disease Control and Prevention (CDC):  Call (228)170-9668  (1-800-CDC-INFO) or  Visit CDC's website at http://hunter.com/ CDC Pneumococcal Polysaccharide Vaccine VIS (12/01/13) This information is not intended to replace advice given to you by your health care provider. Make sure you discuss any questions you have with your health care provider. Document Released: 05/24/2006 Document Revised: 04/16/2016 Document Reviewed: 04/16/2016 Elsevier Interactive Patient Education  2017 [place] (Tetanus and Diphtheria): What You Need to Know 1. Why get vaccinated? Tetanus  and diphtheria are very serious diseases. They are rare in the [place] today, but people who do become infected often have severe complications. Td vaccine is used to protect adolescents and adults from both of these diseases. Both tetanus and diphtheria are infections caused by bacteria. Diphtheria spreads from person to person through coughing or sneezing. Tetanus-causing bacteria enter the body through cuts, scratches, or wounds. TETANUS (lockjaw) causes painful muscle tightening and stiffness, usually all over the body.  It can lead to tightening of muscles in the head and neck so you can't open your mouth, swallow, or sometimes even breathe. Tetanus kills about 1 out of every 10 people who are infected even after receiving the best medical care. DIPHTHERIA can cause a thick coating to form in the back of the throat.  It can lead to breathing problems, paralysis, heart failure, and death. Before vaccines, as many as 200,000 cases of diphtheria and hundreds of cases of tetanus were reported in the [place] each year. Since vaccination began, reports of cases for both diseases have dropped by about 99%. 2. Td vaccine Td vaccine can protect adolescents and adults from tetanus and diphtheria. Td is usually given as a booster dose every 10 years but it can also  be given earlier after a severe and dirty wound or burn. Another vaccine, called Tdap, which protects  against pertussis in addition to tetanus and diphtheria, is sometimes recommended instead of Td vaccine. Your doctor or the person giving you the vaccine can give you more information. Td may safely be given at the same time as other vaccines. 3. Some people should not get this vaccine  A person who has ever had a life-threatening allergic reaction after a previous dose of any tetanus or diphtheria containing vaccine, OR has a severe allergy to any part of this vaccine, should not get Td vaccine. Tell the person giving the vaccine about any severe allergies.  Talk to your doctor if you:  had severe pain or swelling after any vaccine containing diphtheria or tetanus,  ever had a condition called Guillain Barre Syndrome (GBS),  aren't feeling well on the day the shot is scheduled. 4. What are the risks from Td vaccine? With any medicine, including vaccines, there is a chance of side effects. These are usually mild and go away on their own. Serious reactions are also possible but are rare. Most people who get Td vaccine do not have any problems with it. Mild problems following Td vaccine: (Did not interfere with activities)  Pain where the shot was given (about 8 people in 10)  Redness or swelling where the shot was given (about 1 person in 4)  Mild fever (rare)  Headache (about 1 person in 4)  Tiredness (about 1 person in 4) Moderate problems following Td vaccine: (Interfered with activities, but did not require medical attention)  Fever over 102F (rare) Severe problems following Td vaccine: (Unable to perform usual activities; required medical attention)  Swelling, severe pain, bleeding and/or redness in the arm where the shot was given (rare). Problems that could happen after any vaccine:  People sometimes faint after a medical procedure, including vaccination. Sitting or lying down for about 15 minutes can help prevent fainting, and injuries caused by a fall. Tell your doctor  if you feel dizzy, or have vision changes or ringing in the ears.  Some people get severe pain in the shoulder and have difficulty moving the arm where a shot was given. This happens very rarely.  Any medication can cause a severe allergic reaction. Such reactions from a vaccine are very rare, estimated at fewer than 1 in a million doses, and would happen within a few minutes to a few hours after the vaccination. As with any medicine, there is a very remote chance of a vaccine causing a serious injury or death. The safety of vaccines is always being monitored. For more information, visit: http://www.aguilar.org/ 5. What if there is a serious reaction? What should I look for? Look for anything that concerns you, such as signs of a severe allergic reaction, very high fever, or unusual behavior. Signs of a severe allergic reaction can include hives, swelling of the face and throat, difficulty breathing, a fast heartbeat, dizziness, and weakness. These would usually start a few minutes to a few hours after the vaccination. What should I do?  If you think it is a severe allergic reaction or other emergency that can't wait, call 9-1-1 or get the person to the nearest hospital. Otherwise, call your doctor.  Afterward, the reaction should be reported to the Vaccine Adverse Event Reporting System (VAERS). Your doctor might file this report, or you can do it yourself through the VAERS web site at www.vaers.SamedayNews.es, or by calling [phone].  VAERS does not give medical advice. 6. The National Vaccine Injury Compensation Program The [employer] Vaccine Injury Compensation Program (VICP) is a federal program that was created to compensate people who may have been injured by certain vaccines. Persons who believe they may have been injured by a vaccine can learn about the program and about filing a claim by calling (407)050-1371 or visiting the [place] website at GoldCloset.com.ee. There is a  time limit to file a claim for compensation. 7. How can I learn more?  Ask your doctor. He or she can give you the vaccine package insert or suggest other sources of information.  Call your local or state health department.  Contact the Centers for Disease Control and Prevention (CDC):  Call 361-129-8944 (1-800-CDC-INFO)  Visit CDC's website at http://hunter.com/ CDC Td Vaccine VIS (11/19/15) This information is not intended to replace advice given to you by your health care provider. Make sure you discuss any questions you have with your health care provider. Document Released: 05/24/2006 Document Revised: 04/16/2016 Document Reviewed: 04/16/2016 Elsevier Interactive Patient Education  2017 [place]. Influenza Virus Vaccine injection (Fluarix) What is this medicine? INFLUENZA VIRUS VACCINE (in floo EN zuh VAHY ruhs vak SEEN) helps to reduce the risk of getting influenza also known as the flu. This medicine may be used for other purposes; ask your health care provider or pharmacist if you have questions. COMMON BRAND NAME(S): Fluarix, Fluzone What should I tell my health care provider before I take this medicine? They need to know if you have any of these conditions: -bleeding disorder like hemophilia -fever or infection -Guillain-Barre syndrome or other neurological problems -immune system problems -infection with the human immunodeficiency virus (HIV) or AIDS -low blood platelet counts -multiple sclerosis -an unusual or allergic reaction to influenza virus vaccine, eggs, chicken proteins, latex, gentamicin, other medicines, foods, dyes or preservatives -pregnant or trying to get pregnant -breast-feeding How should I use this medicine? This vaccine is for injection into a muscle. It is given by a health care professional. A copy of Vaccine Information Statements will be given before each vaccination. Read this sheet carefully each time. The sheet may change  frequently. Talk to your pediatrician regarding the use of this medicine in children. Special care may be needed. Overdosage: If you think you have taken too much of this medicine contact a poison control center or emergency room at once. NOTE: This medicine is only for you. Do not share this medicine with others. What if I miss a dose? This does not apply. What may interact with this medicine? -chemotherapy or radiation therapy -medicines that lower your immune system like etanercept, anakinra, infliximab, and adalimumab -medicines that treat or prevent blood clots like warfarin -phenytoin -steroid medicines like prednisone or cortisone -theophylline -vaccines This list may not describe all possible interactions. Give your health care provider a list of all the medicines, herbs, non-prescription drugs, or dietary supplements you use. Also tell them if you smoke, drink alcohol, or use illegal drugs. Some items may interact with your medicine. What should I watch for while using this medicine? Report any side effects that do not go away within 3 days to your doctor or health care professional. Call your health care provider if any unusual symptoms occur within 6 weeks of receiving this vaccine. You may still catch the flu, but the illness is not usually as bad. You cannot get the flu from the vaccine. The vaccine will not protect against colds or other illnesses that may cause  fever. The vaccine is needed every year. What side effects may I notice from receiving this medicine? Side effects that you should report to your doctor or health care professional as soon as possible: -allergic reactions like skin rash, itching or hives, swelling of the face, lips, or tongue Side effects that usually do not require medical attention (report to your doctor or health care professional if they continue or are bothersome): -fever -headache -muscle aches and pains -pain, tenderness, redness, or swelling at  site where injected -weak or tired This list may not describe all possible side effects. Call your doctor for medical advice about side effects. You may report side effects to FDA at 1-800-FDA-1088. Where should I keep my medicine? This vaccine is only given in a clinic, pharmacy, doctor's office, or other health care setting and will not be stored at home. NOTE: This sheet is a summary. It may not cover all possible information. If you have questions about this medicine, talk to your doctor, pharmacist, or health care provider.  2017 Elsevier/Gold Standard (2008-02-22 09:30:40)

## 2016-08-25 NOTE — Progress Notes (Signed)
James Ayers, is a 83 y.o. male  [id number]  [id number]  DOB - Feb 25, 1934  CC:  Chief Complaint  Patient presents with  . New Patient (Initial Visit)       HPI: James Ayers is a 83 y.o. male here today to establish medical care.  New to our clinic. Was getting some care at [facility], but they do not have any records.  He has hx of bph, htn, bilat glaucoma, with left eye blindness, and hearing loss, wears bilat hearing aids.    He is here today to set up care. C/o of intermittent alhtragias diffusely as well, especially chronic lower back and worsening right knee pain. He uses a walker.  Of notes, he mention feeling dizzy when he gets up from sitting/lying position to standing position, denies vertiginous sx though. Denies syncope or chest pain.  Denies orthopnea.  Patient has No headache, No chest pain, No abdominal pain - No Nausea, No new weakness tingling or numbness, No Cough - SOB.  Pt is here w/ his dgt-in-law, who is helping to interpret.  Review of Systems: Pre hpi, o/w all systems reviewed and negative.    No Known Allergies Past Medical History:  Diagnosis Date  . Glaucoma    Current Outpatient Prescriptions on File Prior to Visit  Medication Sig Dispense Refill  . aspirin 81 MG chewable tablet Chew 81 mg by mouth daily.    . brimonidine (ALPHAGAN) 0.2 % ophthalmic solution Place 1 drop into both eyes 3 (three) times daily.     Marland Kitchen ibuprofen (ADVIL,MOTRIN) 400 MG tablet Take 1 tablet (400 mg total) by mouth every 6 (six) hours as needed. (Patient not taking: Reported on 08/25/2016) 30 tablet 0  . methocarbamol (ROBAXIN) 500 MG tablet Take 1 tablet (500 mg total) by mouth 2 (two) times daily as needed for muscle spasms. (Patient not taking: Reported on 08/25/2016) 20 tablet 0  . oxyCODONE (ROXICODONE) 5 MG immediate release tablet Take 1 tablet (5 mg total) by mouth every 6 (six) hours as needed for severe pain. (Patient not taking: Reported on  08/25/2016) 5 tablet 0  . Polyvinyl Alcohol-Povidone (CLEAR EYES NATURAL TEARS) 5-6 MG/ML SOLN Apply 2 drops to eye 2 (two) times daily. (Patient not taking: Reported on 08/25/2016) 30 mL 0  . prednisoLONE acetate (PRED FORTE) 1 % ophthalmic suspension Place 1 drop into both eyes 4 (four) times daily.     . timolol (BETIMOL) 0.5 % ophthalmic solution Place 1 drop into both eyes 2 (two) times daily.     . Travoprost, BAK Free, (TRAVATAN) 0.004 % SOLN ophthalmic solution Place 1 drop into both eyes at bedtime.      No current facility-administered medications on file prior to visit.    No family history on file. Social History   Social History  . Marital status: Married    Spouse name: N/A  . Number of children: N/A  . Years of education: N/A   Occupational History  . Not on file.   Social History Main Topics  . Smoking status: Never Smoker  . Smokeless tobacco: Never Used  . Alcohol use No  . Drug use: No  . Sexual activity: No   Other Topics Concern  . Not on file   Social History Narrative  . No narrative on file    Objective:   Vitals:   08/25/16 1346  BP: 109/67  Pulse: 65  Resp: 16  Temp: 97.8 F (36.6 C)  Filed Weights   08/25/16 1346  Weight: 192 lb 3.2 oz (87.2 kg)    BP Readings from Last 3 Encounters:  08/25/16 109/67  06/18/16 160/63  04/02/16 162/73    Physical Exam: Constitutional: Patient appears well-developed and well-nourished. No distress. AAOx3, obese, tall statured. Pleasant. HENT: Normocephalic, atraumatic, External right and left ear normal. Oropharynx is clear and moist.   bilat TMs clear, he has bilat hearing aids. Eyes: Conjunctivae and EOM are normal. PERRL, no scleral icterus. Neck: Normal ROM. Neck supple. No JVD.  CVS: RRR, S1/S2 +, no murmurs, no gallops, no carotid bruit.  Pulmonary: Effort and breath sounds normal, no stridor, rhonchi, wheezes, rales.  Abdominal: Soft. BS +, no distension, tenderness, rebound or guarding.    Musculoskeletal: Normal range of motion. No edema and no tenderness.  LE: bilat/ no c/c, trace bilat pedal edema 1+, pulses 2+ bilateral. Neuro: Alert.  muscle tone coordination wnl. No cranial nerve deficit grossly. Skin: Skin is warm and dry. No rash noted. Not diaphoretic. No erythema. No pallor. Psychiatric: Normal mood and affect. Behavior, judgment, thought content normal.  Lab Results  Component Value Date   WBC 8.5 01/06/2008   HGB 15.7 01/06/2008   HCT 46.3 01/06/2008   MCV 87.0 01/06/2008   PLT 201 01/06/2008   Lab Results  Component Value Date   CREATININE 1.27 01/06/2008   BUN 20 01/06/2008   NA 142 01/06/2008   K 4.2 01/06/2008   CL 102 01/06/2008   CO2 27 01/06/2008    No results found for: HGBA1C Lipid Panel     Component Value Date/Time   CHOL 205 (H) 01/06/2008 2242   TRIG 171 (H) 01/06/2008 2242   HDL 44 01/06/2008 2242   CHOLHDL 4.7 Ratio 01/06/2008 2242   VLDL 34 01/06/2008 2242   LDLCALC 127 (H) 01/06/2008 2242        Depression screen PHQ 2/9 08/25/2016  Decreased Interest 0  Down, Depressed, Hopeless 0  PHQ - 2 Score 0    Assessment and plan:   1. HTN (hypertension), benign Controlled, but has le edema, has been on norvasc 5 for a long time, - continue norvasc 5 for now - CMP and Liver - CBC, bnp  2. Benign prostatic hyperplasia with lower urinary tract symptoms, symptom details unspecified,  - sx controlled on current combination - on finasteride 5 and doxazosin 4 qd - PSA  3. Dizziness, suspect positional. - recd taking his time when getting up, shaking the legs first for better blood flow will help - rx ted hose provided as well, recd wear during day, take off at night. - Brain natriuretic peptide  4. Chronic midline low back pain, with sciatica presence unspecified - may be all chronic, but will chk psa as well. - Uric Acid - Urinalysis Dipstick  5. Thyroid disorder screen - TSH  6. Diabetes mellitus screening - POCT  A1C  7. Vitamin D deficiency - VITAMIN D 25 Hydroxy (Vit-D Deficiency, Fractures)  8. Needs financial aid.  Return in about 3 months (around 11/23/2016), or if symptoms worsen or fail to improve.  The patient was given clear instructions to go to ER or return to medical center if symptoms don't improve, worsen or new problems develop. The patient verbalized understanding. The patient was told to call to get lab results if they haven't heard anything in the next week.    This note has been created with Surveyor, quantity. Any transcriptional errors  are unintentional.   Maren Reamer, MD, [facility] [place], [place]   08/25/2016, 2:14 PM

## 2016-08-26 LAB — CMP AND LIVER
ALK PHOS: 71 U/L (ref 40–115)
ALT: 13 U/L (ref 9–46)
AST: 13 U/L (ref 10–35)
Albumin: 3.6 g/dL (ref 3.6–5.1)
BILIRUBIN INDIRECT: 0.5 mg/dL (ref 0.2–1.2)
BUN: 21 mg/dL (ref 7–25)
Bilirubin, Direct: 0.2 mg/dL (ref <=0.2)
CO2: 19 mmol/L — ABNORMAL LOW (ref 20–31)
Calcium: 8.9 mg/dL (ref 8.6–10.3)
Chloride: 110 mmol/L (ref 98–110)
Creat: 1.52 mg/dL — ABNORMAL HIGH (ref 0.70–1.11)
Glucose, Bld: 115 mg/dL — ABNORMAL HIGH (ref 65–99)
POTASSIUM: 4.2 mmol/L (ref 3.5–5.3)
SODIUM: 142 mmol/L (ref 135–146)
TOTAL PROTEIN: 7.2 g/dL (ref 6.1–8.1)
Total Bilirubin: 0.7 mg/dL (ref 0.2–1.2)

## 2016-08-26 LAB — BRAIN NATRIURETIC PEPTIDE: BRAIN NATRIURETIC PEPTIDE: 10.6 pg/mL (ref <100)

## 2016-08-26 LAB — URIC ACID: URIC ACID, SERUM: 7.5 mg/dL (ref 4.0–8.0)

## 2016-08-26 LAB — PSA: PSA: 1.2 ng/mL (ref <=4.0)

## 2016-08-26 LAB — VITAMIN D 25 HYDROXY (VIT D DEFICIENCY, FRACTURES): Vit D, 25-Hydroxy: 21 ng/mL — ABNORMAL LOW (ref 30–100)

## 2016-08-27 ENCOUNTER — Other Ambulatory Visit: Payer: Self-pay | Admitting: Internal Medicine

## 2016-08-27 DIAGNOSIS — R7989 Other specified abnormal findings of blood chemistry: Secondary | ICD-10-CM

## 2016-08-28 ENCOUNTER — Telehealth: Payer: Self-pay

## 2016-08-28 NOTE — Telephone Encounter (Signed)
Contacted pt to go over lab reuslts pt is aware and doesn't have any questions or concerns

## 2016-10-30 ENCOUNTER — Other Ambulatory Visit: Payer: Self-pay | Admitting: Gastroenterology

## 2016-11-02 ENCOUNTER — Other Ambulatory Visit: Payer: Self-pay | Admitting: Gastroenterology

## 2016-11-04 ENCOUNTER — Encounter (HOSPITAL_COMMUNITY): Payer: Self-pay | Admitting: *Deleted

## 2016-11-04 NOTE — Progress Notes (Signed)
INTERPRETER  CONFIRMATION FOR 11-05-16 ARABIC INTERPRETER ON CHART

## 2016-11-05 ENCOUNTER — Encounter (HOSPITAL_COMMUNITY): Payer: Self-pay | Admitting: *Deleted

## 2016-11-05 ENCOUNTER — Encounter (HOSPITAL_COMMUNITY)
Admission: RE | Disposition: A | Discharge status: Home / Self Care | Payer: Self-pay | Source: Ambulatory Visit | Attending: Gastroenterology

## 2016-11-05 ENCOUNTER — Ambulatory Visit (HOSPITAL_COMMUNITY)
Admission: RE | Admit: 2016-11-05 | Discharge: 2016-11-05 | Disposition: A | Discharge status: Home / Self Care | Payer: Self-pay | Source: Ambulatory Visit | Attending: Gastroenterology | Admitting: Gastroenterology

## 2016-11-05 ENCOUNTER — Ambulatory Visit (HOSPITAL_COMMUNITY): Payer: Self-pay | Admitting: Anesthesiology

## 2016-11-05 DIAGNOSIS — B9681 Helicobacter pylori [H. pylori] as the cause of diseases classified elsewhere: Secondary | ICD-10-CM | POA: Insufficient documentation

## 2016-11-05 DIAGNOSIS — D123 Benign neoplasm of transverse colon: Secondary | ICD-10-CM | POA: Insufficient documentation

## 2016-11-05 DIAGNOSIS — K2950 Unspecified chronic gastritis without bleeding: Secondary | ICD-10-CM | POA: Insufficient documentation

## 2016-11-05 DIAGNOSIS — K219 Gastro-esophageal reflux disease without esophagitis: Secondary | ICD-10-CM | POA: Diagnosis present

## 2016-11-05 DIAGNOSIS — Z79899 Other long term (current) drug therapy: Secondary | ICD-10-CM | POA: Insufficient documentation

## 2016-11-05 DIAGNOSIS — R10814 Left lower quadrant abdominal tenderness: Secondary | ICD-10-CM | POA: Diagnosis present

## 2016-11-05 DIAGNOSIS — I10 Essential (primary) hypertension: Secondary | ICD-10-CM | POA: Insufficient documentation

## 2016-11-05 DIAGNOSIS — K625 Hemorrhage of anus and rectum: Secondary | ICD-10-CM | POA: Diagnosis present

## 2016-11-05 DIAGNOSIS — R10819 Abdominal tenderness, unspecified site: Secondary | ICD-10-CM | POA: Insufficient documentation

## 2016-11-05 DIAGNOSIS — Z7982 Long term (current) use of aspirin: Secondary | ICD-10-CM | POA: Insufficient documentation

## 2016-11-05 HISTORY — DX: Gastro-esophageal reflux disease without esophagitis: K21.9

## 2016-11-05 HISTORY — PX: ESOPHAGOGASTRODUODENOSCOPY (EGD) WITH PROPOFOL: SHX5813

## 2016-11-05 HISTORY — DX: Benign prostatic hyperplasia without lower urinary tract symptoms: N40.0

## 2016-11-05 HISTORY — DX: Essential (primary) hypertension: I10

## 2016-11-05 HISTORY — PX: COLONOSCOPY WITH PROPOFOL: SHX5780

## 2016-11-05 HISTORY — DX: Vitamin D deficiency, unspecified: E55.9

## 2016-11-05 SURGERY — COLONOSCOPY WITH PROPOFOL
Anesthesia: Monitor Anesthesia Care

## 2016-11-05 MED ORDER — PROPOFOL 10 MG/ML IV BOLUS
INTRAVENOUS | Status: AC
  Filled 2016-11-05: qty 60

## 2016-11-05 MED ORDER — LIDOCAINE 2% (20 MG/ML) 5 ML SYRINGE
INTRAMUSCULAR | Status: AC
Start: 2016-11-05 — End: 2016-11-05
  Filled 2016-11-05: qty 5

## 2016-11-05 MED ORDER — SODIUM CHLORIDE 0.9 % IV SOLN: INTRAVENOUS | Status: DC

## 2016-11-05 MED ORDER — PROPOFOL 10 MG/ML IV BOLUS
INTRAVENOUS | Status: DC | PRN
  Administered 2016-11-05: 20 mg via INTRAVENOUS

## 2016-11-05 MED ORDER — PROPOFOL 500 MG/50ML IV EMUL
INTRAVENOUS | Status: DC | PRN
  Administered 2016-11-05: 140 ug/kg/min via INTRAVENOUS

## 2016-11-05 MED ORDER — LACTATED RINGERS IV SOLN
INTRAVENOUS | Status: DC
Start: 2016-11-05 — End: 2016-11-05
  Administered 2016-11-05: 1000 mL via INTRAVENOUS

## 2016-11-05 MED ORDER — LIDOCAINE 2% (20 MG/ML) 5 ML SYRINGE
INTRAMUSCULAR | Status: DC | PRN
  Administered 2016-11-05: 50 mg via INTRAVENOUS

## 2016-11-05 SURGICAL SUPPLY — 24 items

## 2016-11-05 NOTE — H&P (Signed)
Date of Initial H&P: 10/28/16  History reviewed, patient examined, no change in status, stable for surgery.

## 2016-11-05 NOTE — Transfer of Care (Signed)
Immediate Anesthesia Transfer of Care Note  Patient: James Ayers  Procedure(s) Performed: Procedure(s): COLONOSCOPY WITH PROPOFOL (N/A) ESOPHAGOGASTRODUODENOSCOPY (EGD) WITH PROPOFOL (N/A)  Patient Location: Endoscopy Unit  Anesthesia Type:MAC  Level of Consciousness: awake  Airway & Oxygen Therapy: Patient Spontanous Breathing and Patient connected to nasal cannula oxygen  Post-op Assessment: Report given to RN and Post -op Vital signs reviewed and stable  Post vital signs: Reviewed and stable  Last Vitals:  Vitals:   11/05/16 0805  BP: (!) 193/64  Resp: 20  Temp: 36.5 C    Last Pain:  Vitals:   11/05/16 0805  TempSrc: Oral         Complications: No apparent anesthesia complications

## 2016-11-05 NOTE — Op Note (Signed)
[facility] Patient Name: James Ayers Procedure Date: 11/05/2016 MRN: [id number] Attending MD: Lear Ng , MD Date of Birth: 1934-07-21 CSN: [id number] Age: 83 Admit Type: Outpatient Procedure:                Upper GI endoscopy Indications:              Esophageal reflux Providers:                Lear Ng, MD, Carmie End, RN, Cherylynn Ridges, Technician, Danley Danker, CRNA Referring MD:              Medicines:                Propofol per Anesthesia, Monitored Anesthesia Care Complications:            No immediate complications. Estimated Blood Loss:     Estimated blood loss was minimal. Procedure:                Pre-Anesthesia Assessment:                           - Prior to the procedure, a History and Physical                            was performed, and patient medications and                            allergies were reviewed. The patient's tolerance of                            previous anesthesia was also reviewed. The risks                            and benefits of the procedure and the sedation                            options and risks were discussed with the patient.                            All questions were answered, and informed consent                            was obtained. Prior Anticoagulants: The patient has                            taken no previous anticoagulant or antiplatelet                            agents. ASA Grade Assessment: III - A patient with                            severe systemic disease. After reviewing the risks  and benefits, the patient was deemed in                            satisfactory condition to undergo the procedure.                           After obtaining informed consent, the endoscope was                            passed under direct vision. Throughout the                            procedure, the patient's blood pressure,  pulse, and                            oxygen saturations were monitored continuously. The                            Endoscope was introduced through the mouth, and                            advanced to the second part of duodenum. The upper                            GI endoscopy was accomplished without difficulty.                            The patient tolerated the procedure well. Scope In: Scope Out: Findings:      The examined esophagus was normal.      The Z-line was regular and was found 42 cm from the incisors.      Segmental moderate inflammation characterized by congestion (edema),       erosions, erythema and shallow ulcerations was found in the gastric       antrum. Biopsies were taken with a cold forceps for histology. Estimated       blood loss was minimal.      The cardia and gastric fundus were normal on retroflexion.      The exam of the stomach was otherwise normal.      The examined duodenum was normal. Impression:               - Normal esophagus.                           - Z-line regular, 42 cm from the incisors.                           - Gastritis. Biopsied.                           - Normal examined duodenum. Moderate Sedation:      N/A- Per Anesthesia Care Recommendation:           - Patient has a contact number available for  emergencies. The signs and symptoms of potential                            delayed complications were discussed with the                            patient. Return to normal activities tomorrow.                            Written discharge instructions were provided to the                            patient.                           - Resume previous diet.                           - Await pathology results.                           - Follow an antireflux regimen.                           - Continue present medications. Procedure Code(s):        --- Professional ---                           [phone],  Esophagogastroduodenoscopy, flexible,                            transoral; with biopsy, single or multiple Diagnosis Code(s):        --- Professional ---                           K21.9, Gastro-esophageal reflux disease without                            esophagitis                           K29.70, Gastritis, unspecified, without bleeding CPT copyright 2016 American Medical Association. All rights reserved. The codes documented in this report are preliminary and upon coder review may  be revised to meet current compliance requirements. Lear Ng, MD 11/05/2016 9:17:23 AM This report has been signed electronically. Number of Addenda: 0

## 2016-11-05 NOTE — Interval H&P Note (Signed)
History and Physical Interval Note:  11/05/2016 8:31 AM  James Ayers  has presented today for surgery, with the diagnosis of rectal bleeding/abdominal tenderness  The various methods of treatment have been discussed with the patient and family. After consideration of risks, benefits and other options for treatment, the patient has consented to  Procedure(s): COLONOSCOPY WITH PROPOFOL (N/A) ESOPHAGOGASTRODUODENOSCOPY (EGD) WITH PROPOFOL (N/A) as a surgical intervention .  The patient's history has been reviewed, patient examined, no change in status, stable for surgery.  I have reviewed the patient's chart and labs.  Questions were answered to the patient's satisfaction.     [place] C.

## 2016-11-05 NOTE — Op Note (Signed)
[facility] [place] Patient Name: James Ayers Procedure Date: 11/05/2016 MRN: [id number] Attending MD: Lear Ng , MD Date of Birth: 12/31/1933 CSN: [id number] Age: 83 Admit Type: Outpatient Procedure:                Colonoscopy Indications:              This is the patient's first colonoscopy, Abdominal                            pain in the left lower quadrant, Rectal bleeding Providers:                Lear Ng, MD, Carmie End, RN, Cherylynn Ridges, Technician, Danley Danker, CRNA Referring MD:              Medicines:                Propofol per Anesthesia, Monitored Anesthesia Care Complications:            No immediate complications. Estimated Blood Loss:     Estimated blood loss: none. Procedure:                Pre-Anesthesia Assessment:                           - Prior to the procedure, a History and Physical                            was performed, and patient medications and                            allergies were reviewed. The patient's tolerance of                            previous anesthesia was also reviewed. The risks                            and benefits of the procedure and the sedation                            options and risks were discussed with the patient.                            All questions were answered, and informed consent                            was obtained. Prior Anticoagulants: The patient has                            taken no previous anticoagulant or antiplatelet                            agents. ASA Grade Assessment: III - A patient with  severe systemic disease. After reviewing the risks                            and benefits, the patient was deemed in                            satisfactory condition to undergo the procedure.                           After obtaining informed consent, the colonoscope                            was passed under  direct vision. Throughout the                            procedure, the patient's blood pressure, pulse, and                            oxygen saturations were monitored continuously. The                            EC-[id number] ([id number]) scope was introduced through                            the anus and advanced to the the cecum, identified                            by appendiceal orifice and ileocecal valve. The                            colonoscopy was somewhat difficult due to a                            tortuous colon. Successful completion of the                            procedure was aided by straightening and shortening                            the scope to obtain bowel loop reduction. The                            patient tolerated the procedure well. The quality                            of the bowel preparation was adequate and good. The                            terminal ileum, the appendiceal orifice and the                            rectum were photographed. Scope In: 9:00:13 AM Scope Out: 9:11:27 AM Scope Withdrawal Time: 0 hours 3 minutes 16 seconds  Total  Procedure Duration: 0 hours 11 minutes 14 seconds  Findings:      The perianal and digital rectal examinations were normal.      A 8 mm polyp was found in the transverse colon. The polyp was sessile.       The polyp was removed with a hot snare. Resection and retrieval were       complete. Estimated blood loss: none.      Internal hemorrhoids were found during retroflexion. The hemorrhoids       were medium-sized and Grade I (internal hemorrhoids that do not       prolapse).      The terminal ileum appeared normal. Impression:               - One 8 mm polyp in the transverse colon, removed                            with a hot snare. Resected and retrieved.                           - Internal hemorrhoids.                           - The examined portion of the ileum was normal. Moderate Sedation:      N/A-  Per Anesthesia Care Recommendation:           - Patient has a contact number available for                            emergencies. The signs and symptoms of potential                            delayed complications were discussed with the                            patient. Return to normal activities tomorrow.                            Written discharge instructions were provided to the                            patient.                           - High fiber diet.                           - Await pathology results.                           - No aspirin, ibuprofen, naproxen, or other                            non-steroidal anti-inflammatory drugs for 2 weeks.                           - Repeat colonoscopy for surveillance based on  pathology results. Procedure Code(s):        --- Professional ---                           [id number], Colonoscopy, flexible; with removal of                            tumor(s), polyp(s), or other lesion(s) by snare                            technique Diagnosis Code(s):        --- Professional ---                           K62.5, Hemorrhage of anus and rectum                           R10.32, Left lower quadrant pain                           D12.3, Benign neoplasm of transverse colon (hepatic                            flexure or splenic flexure)                           K64.0, First degree hemorrhoids CPT copyright 2016 American Medical Association. All rights reserved. The codes documented in this report are preliminary and upon coder review may  be revised to meet current compliance requirements. Lear Ng, MD 11/05/2016 9:23:32 AM This report has been signed electronically. Number of Addenda: 0

## 2016-11-05 NOTE — Anesthesia Postprocedure Evaluation (Addendum)
Anesthesia Post Note  Patient: Mead Zanni  Procedure(s) Performed: Procedure(s) (LRB): COLONOSCOPY WITH PROPOFOL (N/A) ESOPHAGOGASTRODUODENOSCOPY (EGD) WITH PROPOFOL (N/A)  Patient location during evaluation: Endoscopy Anesthesia Type: MAC Level of consciousness: awake and alert Pain management: pain level controlled Vital Signs Assessment: post-procedure vital signs reviewed and stable Respiratory status: spontaneous breathing, nonlabored ventilation, respiratory function stable and patient connected to nasal cannula oxygen Cardiovascular status: stable and blood pressure returned to baseline Anesthetic complications: no       Last Vitals:  Vitals:   11/05/16 0955 11/05/16 1000  BP:    Pulse: (!) 59 (!) 58  Resp: 16 16  Temp:      Last Pain:  Vitals:   11/05/16 0921  TempSrc: Oral                 Majesty Stehlin,JAMES TERRILL

## 2016-11-05 NOTE — Anesthesia Preprocedure Evaluation (Addendum)
Anesthesia Evaluation  Patient identified by MRN, date of birth, ID band Patient awake    Reviewed: Allergy & Precautions, NPO status , Patient's Chart, lab work & pertinent test results  Airway Mallampati: II  TM Distance: >3 FB Neck ROM: Full    Dental  (+) Edentulous Upper   Pulmonary neg pulmonary ROS,    breath sounds clear to auscultation       Cardiovascular hypertension,  Rhythm:Regular Rate:Normal     Neuro/Psych    GI/Hepatic Neg liver ROS, GERD  ,  Endo/Other  negative endocrine ROS  Renal/GU negative Renal ROS     Musculoskeletal   Abdominal   Peds  Hematology   Anesthesia Other Findings   Reproductive/Obstetrics                            Anesthesia Physical Anesthesia Plan  ASA: III  Anesthesia Plan: MAC   Post-op Pain Management:    Induction: Intravenous  Airway Management Planned: Nasal Cannula and Natural Airway  Additional Equipment:   Intra-op Plan:   Post-operative Plan:   Informed Consent: I have reviewed the patients History and Physical, chart, labs and discussed the procedure including the risks, benefits and alternatives for the proposed anesthesia with the patient or authorized representative who has indicated his/her understanding and acceptance.     Plan Discussed with: CRNA  Anesthesia Plan Comments:         Anesthesia Quick Evaluation

## 2016-12-09 ENCOUNTER — Encounter: Payer: Self-pay | Admitting: Internal Medicine

## 2016-12-09 ENCOUNTER — Ambulatory Visit: Payer: Self-pay | Attending: Internal Medicine | Admitting: Internal Medicine

## 2016-12-09 ENCOUNTER — Ambulatory Visit (HOSPITAL_COMMUNITY)
Admission: RE | Admit: 2016-12-09 | Discharge: 2016-12-09 | Disposition: A | Discharge status: Home / Self Care | Payer: Self-pay | Source: Ambulatory Visit | Attending: Internal Medicine | Admitting: Internal Medicine

## 2016-12-09 VITALS — BP 167/80 | HR 73 | Temp 98.4°F | Resp 16 | Wt 201.2 lb

## 2016-12-09 DIAGNOSIS — M25562 Pain in left knee: Secondary | ICD-10-CM | POA: Insufficient documentation

## 2016-12-09 DIAGNOSIS — I10 Essential (primary) hypertension: Secondary | ICD-10-CM | POA: Insufficient documentation

## 2016-12-09 DIAGNOSIS — M25561 Pain in right knee: Secondary | ICD-10-CM | POA: Insufficient documentation

## 2016-12-09 DIAGNOSIS — K219 Gastro-esophageal reflux disease without esophagitis: Secondary | ICD-10-CM | POA: Insufficient documentation

## 2016-12-09 DIAGNOSIS — A048 Other specified bacterial intestinal infections: Secondary | ICD-10-CM

## 2016-12-09 DIAGNOSIS — N401 Enlarged prostate with lower urinary tract symptoms: Secondary | ICD-10-CM | POA: Insufficient documentation

## 2016-12-09 DIAGNOSIS — Z880 Allergy status to penicillin: Secondary | ICD-10-CM | POA: Insufficient documentation

## 2016-12-09 DIAGNOSIS — Z7982 Long term (current) use of aspirin: Secondary | ICD-10-CM | POA: Insufficient documentation

## 2016-12-09 DIAGNOSIS — Z758 Other problems related to medical facilities and other health care: Secondary | ICD-10-CM | POA: Insufficient documentation

## 2016-12-09 DIAGNOSIS — Z9889 Other specified postprocedural states: Secondary | ICD-10-CM | POA: Insufficient documentation

## 2016-12-09 DIAGNOSIS — M170 Bilateral primary osteoarthritis of knee: Secondary | ICD-10-CM | POA: Insufficient documentation

## 2016-12-09 DIAGNOSIS — Z789 Other specified health status: Secondary | ICD-10-CM

## 2016-12-09 MED ORDER — METRONIDAZOLE 500 MG PO TABS: 500.0000 mg | ORAL_TABLET | Freq: Two times a day (BID) | ORAL | 0 refills | Status: DC

## 2016-12-09 MED ORDER — AMLODIPINE BESYLATE 5 MG PO TABS: 5.0000 mg | ORAL_TABLET | Freq: Every day | ORAL | 3 refills | Status: DC

## 2016-12-09 MED ORDER — CLARITHROMYCIN 500 MG PO TABS: 1000.0000 mg | ORAL_TABLET | Freq: Two times a day (BID) | ORAL | 0 refills | Status: DC

## 2016-12-09 MED ORDER — PANTOPRAZOLE SODIUM 40 MG PO TBEC: 40.0000 mg | DELAYED_RELEASE_TABLET | Freq: Two times a day (BID) | ORAL | 0 refills | Status: DC

## 2016-12-09 MED ORDER — DICLOFENAC SODIUM 1 % TD GEL: 2.0000 g | Freq: Four times a day (QID) | TRANSDERMAL | 2 refills | Status: DC

## 2016-12-09 MED FILL — AMLODIPINE BESYLATE 5 MG TA: 5 | 30 days supply | Qty: 30 | Fill #0

## 2016-12-09 MED FILL — ?METRONIDAZOLE 500 MG TABLE: 500 | 5 days supply | Qty: 10 | Fill #0

## 2016-12-09 MED FILL — PANTOPRAZOLE SOD DR 40 MG T: 40 | 15 days supply | Qty: 30 | Fill #0

## 2016-12-09 MED FILL — DICLOFENAC SODIUM 1% GEL: 1 | 12 days supply | Qty: 100 | Fill #0

## 2016-12-09 MED FILL — ?CLARITHROMYCIN 500 MG TABS: 500 MG | 14 days supply | Qty: 56 | Fill #0

## 2016-12-09 NOTE — Progress Notes (Signed)
James Ayers, is a 83 y.o. male  QBH:[id number]  OXB:[id number]  DOB - Apr 16, 1934  Chief Complaint  Patient presents with  . Hypertension        Subjective:   James Ayers is a 83 y.o. male here today for a follow up visit, last seen 08/25/16. Of  Note pt has had egd and colonoscopy  11/05/16, found hpylori, but unable to afford abx prescription. Has hemorrhoids as well, but denies any further bleeding.  Hx of htn, but only was taking norvasc 5mg  prn.  Hx of bph, on cardura and finasteride, [place]  c/o more of more knee pains bilat and his legs get tired after long walks, makes it difficult to walk up stairs at times.  Knees pain if at night as well.    Patient has No headache, No chest pain, No abdominal pain - No Nausea, No new weakness tingling or numbness, No Cough - SOB.  Pt is here w/ his dgt-in-law who is helping to interpret.  Problem  Language Barrier  Bacterial Infection Due to H. Pylori    ALLERGIES: Allergies  Allergen Reactions  . Penicillins Rash    Has patient had a PCN reaction causing immediate rash, facial/tongue/throat swelling, SOB or lightheadedness with hypotension: Yes Has patient had a PCN reaction causing severe rash involving mucus membranes or skin necrosis: No Has patient had a PCN reaction that required hospitalization No Has patient had a PCN reaction occurring within the last 10 years: No If all of the above answers are "NO", then may proceed with Cephalosporin use.     PAST MEDICAL HISTORY: Past Medical History:  Diagnosis Date  . BPH (benign prostatic hyperplasia)   . GERD (gastroesophageal reflux disease)   . Glaucoma   . Hypertension   . Vitamin D deficiency     MEDICATIONS AT HOME: Prior to Admission medications   Medication Sig Start Date End Date Taking? Authorizing Provider  acetaminophen (TYLENOL) 325 MG tablet Take 650 mg by mouth every 6 (six) hours as needed for moderate pain or headache.     Historical Provider, MD  amLODipine (NORVASC) 5 MG tablet Take 1 tablet (5 mg total) by mouth daily. 12/09/16   Maren Reamer, MD  aspirin EC 81 MG tablet Take 81 mg by mouth daily.    Historical Provider, MD  brimonidine (ALPHAGAN) 0.2 % ophthalmic solution Place 1 drop into both eyes 2 (two) times daily.     Historical Provider, MD  clarithromycin (BIAXIN) 500 MG tablet Take 2 tablets (1,000 mg total) by mouth 2 (two) times daily. 12/09/16   Maren Reamer, MD  diclofenac sodium (VOLTAREN) 1 % GEL Apply 2 g topically 4 (four) times daily. 12/09/16   Maren Reamer, MD  dorzolamide (TRUSOPT) 2 % ophthalmic solution Place 1 drop into both eyes 2 (two) times daily.    Historical Provider, MD  doxazosin (CARDURA) 4 MG tablet Take 4 mg by mouth at bedtime.     Historical Provider, MD  finasteride (PROSCAR) 5 MG tablet Take 5 mg by mouth at bedtime.     Historical Provider, MD  metroNIDAZOLE (FLAGYL) 500 MG tablet Take 1 tablet (500 mg total) by mouth 2 (two) times daily. 12/09/16   Maren Reamer, MD  pantoprazole (PROTONIX) 40 MG tablet Take 1 tablet (40 mg total) by mouth 2 (two) times daily. 12/09/16   Maren Reamer, MD  prednisoLONE acetate (PRED FORTE) 1 % ophthalmic suspension Place 1 drop  into the left eye 2 (two) times daily.     Historical Provider, MD  Travoprost, BAK Free, (TRAVATAN) 0.004 % SOLN ophthalmic solution Place 1 drop into both eyes at bedtime.     Historical Provider, MD     Objective:   Vitals:   12/09/16 1027  BP: (!) 167/80  Pulse: 73  Resp: 16  Temp: 98.4 F (36.9 C)  TempSrc: Oral  Weight: 201 lb 3.2 oz (91.3 kg)    Exam General appearance : Awake, alert, not in any distress. Speech Clear. Not toxic looking, tall, pleasant. HEENT: Atraumatic and Normocephalic, pupils equally reactive to light. Neck: supple, no JVD.  Chest:Good air entry bilaterally, no added sounds. CVS: S1 S2 regular, no murmurs/gallups or rubs. Abdomen: Bowel sounds active, Non tender,  obese, and not distended with no gaurding, rigidity or rebound. Extremities: B/L Lower Ext shows no edema, both legs are warm to touch, no edema/warmth swelling noted bilat knees, rom intact, neg Homan's sign bilat. Neurology: Awake alert, and oriented X 3, CN II-XII grossly intact, Non focal Skin:No Rash  Data Review Lab Results  Component Value Date   HGBA1C 5.7 08/25/2016    Depression screen [facility] 2/9 12/09/2016 08/25/2016  Decreased Interest 0 0  Down, Depressed, Hopeless 0 0  PHQ - 2 Score 0 0      Assessment & Plan   1. HTN (hypertension), benign Elevated, would recd taking norvasc 5 qd for now, low salt diet encouraged - Basic metabolic panel - fu w/ Travia RN bp check 2 wks, if sbp <105, stop norvasc and tell him to only take it if sbp >140.  If > 130, than continue norvasc 5 qd.  2. Gastroesophageal reflux disease, esophagitis presence not specified egd recent. Will start on ppi bid for hpylori trx  3. Bacterial infection due to H. pylori Could not afford abx rx from [facility] protonix 40bid Clarithromycin 1000mg  bid x 15 days Flagyl 500bid x 5 days  4. Language barrier, limits ability to care for him dgt helping to interpret  5. Benign prostatic hyperplasia with lower urinary tract symptoms, symptom details unspecified [place] cardura and finasteride, [place].  6. 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 - DG Knee Complete 4 Views Left; Future - DG Knee Complete 4 Views Right; Future     Patient have been counseled extensively about nutrition and exercise  Return in about 3 months (around 03/11/2017), or if symptoms worsen or fail to improve.  The patient was given clear instructions to go to ER or return to medical center if symptoms don't improve, worsen or new problems develop. The patient verbalized understanding. The patient was  told to call to get lab results if they haven't heard anything in the next week.   This note has been created with Surveyor, quantity. Any transcriptional errors are unintentional.   Maren Reamer, MD, [place] [facility] [place], [place]   12/09/2016, 11:33 AM

## 2016-12-09 NOTE — Patient Instructions (Addendum)
- 2 wk rn Travia bp check, bring in home blood pressure cuff and readings at home.   Pillow under the knees at night help with knee pains as well.  Warm heat to knees help with pains too. Trial Condroitin - over the counter, can help w/ bone pains. He needs to be taking Calcium 1200mg / day and Vit D 5,000 IU daily for bone health.    Knee Pain, Adult Many things can cause knee pain. The pain often goes away on its own with time and rest. If the pain does not go away, tests may be done to find out what is causing the pain. Follow these instructions at home: Activity   Rest your knee.  Do not do things that cause pain.  Avoid activities where both feet leave the ground at the same time (high-impact activities). Examples are running, jumping rope, and doing jumping jacks. General instructions   Take medicines only as told by your doctor.  Raise (elevate) your knee when you are resting. Make sure your knee is higher than your heart.  Sleep with a pillow under your knee.  If told, put ice on the knee:  Put ice in a plastic bag.  Place a towel between your skin and the bag.  Leave the ice on for 20 minutes, 2-3 times a day.  Ask your doctor if you should wear an elastic knee support.  Lose weight if you are overweight. Being overweight can make your knee hurt more.  Do not use any tobacco products. These include cigarettes, chewing tobacco, or electronic cigarettes. If you need help quitting, ask your doctor. Smoking may slow down healing. Contact a doctor if:  The pain does not stop.  The pain changes or gets worse.  You have a fever along with knee pain.  Your knee gives out or locks up.  Your knee swells, and becomes worse. Get help right away if:  Your knee feels warm.  You cannot move your knee.  You have very bad knee pain.  You have chest pain.  You have trouble breathing. Summary  Many things can cause knee pain. The pain often goes away on its own  with time and rest.  Avoid activities that put stress on your knee. These include running and jumping rope.  Get help right away if you cannot move your knee, or if your knee feels warm, or if you have trouble breathing. This information is not intended to replace advice given to you by your health care provider. Make sure you discuss any questions you have with your health care provider. Document Released: 10/23/2008 Document Revised: 07/21/2016 Document Reviewed: 07/21/2016 Elsevier Interactive Patient Education  2017 [place].    Low-Sodium Eating Plan Sodium, which is an element that makes up salt, helps you maintain a healthy balance of fluids in your body. Too much sodium can increase your blood pressure and cause fluid and waste to be held in your body. Your health care provider or dietitian may recommend following this plan if you have high blood pressure (hypertension), kidney disease, liver disease, or heart failure. Eating less sodium can help lower your blood pressure, reduce swelling, and protect your heart, liver, and kidneys. What are tips for following this plan? General guidelines   Most people on this plan should limit their sodium intake to 1,500-2,000 mg (milligrams) of sodium each day. Reading food labels   The Nutrition Facts label lists the amount of sodium in one serving of the food.  If you eat more than one serving, you must multiply the listed amount of sodium by the number of servings.  Choose foods with less than 140 mg of sodium per serving.  Avoid foods with 300 mg of sodium or more per serving. Shopping   Look for lower-sodium products, often labeled as "low-sodium" or "no salt added."  Always check the sodium content even if foods are labeled as "unsalted" or "no salt added".  Buy fresh foods.  Avoid canned foods and premade or frozen meals.  Avoid canned, cured, or processed meats  Buy breads that have less than 80 mg of sodium per  slice. Cooking   Eat more home-cooked food and less restaurant, buffet, and fast food.  Avoid adding salt when cooking. Use salt-free seasonings or herbs instead of table salt or sea salt. Check with your health care provider or pharmacist before using salt substitutes.  Cook with plant-based oils, such as canola, sunflower, or olive oil. Meal planning   When eating at a restaurant, ask that your food be prepared with less salt or no salt, if possible.  Avoid foods that contain MSG (monosodium glutamate). MSG is sometimes added to [place] food, bouillon, and some canned foods. What foods are recommended? The items listed may not be a complete list. Talk with your dietitian about what dietary choices are best for you. Grains  Low-sodium cereals, including oats, puffed wheat and rice, and shredded wheat. Low-sodium crackers. Unsalted rice. Unsalted pasta. Low-sodium bread. Whole-grain breads and whole-grain pasta. Vegetables  Fresh or frozen vegetables. "No salt added" canned vegetables. "No salt added" tomato sauce and paste. Low-sodium or reduced-sodium tomato and vegetable juice. Fruits  Fresh, frozen, or canned fruit. Fruit juice. Meats and other protein foods  Fresh or frozen (no salt added) meat, poultry, seafood, and fish. Low-sodium canned tuna and salmon. Unsalted nuts. Dried peas, beans, and lentils without added salt. Unsalted canned beans. Eggs. Unsalted nut butters. Dairy  Milk. Soy milk. Cheese that is naturally low in sodium, such as ricotta cheese, fresh mozzarella, or Swiss cheese Low-sodium or reduced-sodium cheese. Cream cheese. Yogurt. Fats and oils  Unsalted butter. Unsalted margarine with no trans fat. Vegetable oils such as canola or olive oils. Seasonings and other foods  Fresh and dried herbs and spices. Salt-free seasonings. Low-sodium mustard and ketchup. Sodium-free salad dressing. Sodium-free light mayonnaise. Fresh or refrigerated horseradish. Lemon juice.  Vinegar. Homemade, reduced-sodium, or low-sodium soups. Unsalted popcorn and pretzels. Low-salt or salt-free chips. What foods are not recommended? The items listed may not be a complete list. Talk with your dietitian about what dietary choices are best for you. Grains  Instant hot cereals. Bread stuffing, pancake, and biscuit mixes. Croutons. Seasoned rice or pasta mixes. Noodle soup cups. Boxed or frozen macaroni and cheese. Regular salted crackers. Self-rising flour. Vegetables  Sauerkraut, pickled vegetables, and relishes. Olives. [place] fries. Onion rings. Regular canned vegetables (not low-sodium or reduced-sodium). Regular canned tomato sauce and paste (not low-sodium or reduced-sodium). Regular tomato and vegetable juice (not low-sodium or reduced-sodium). Frozen vegetables in sauces. Meats and other protein foods  Meat or fish that is salted, canned, smoked, spiced, or pickled. Bacon, ham, sausage, hotdogs, corned beef, chipped beef, packaged lunch meats, salt pork, jerky, pickled herring, anchovies, regular canned tuna, sardines, salted nuts. Dairy  Processed cheese and cheese spreads. Cheese curds. Blue cheese. Feta cheese. String cheese. Regular cottage cheese. Buttermilk. Canned milk. Fats and oils  Salted butter. Regular margarine. Ghee. Bacon fat. Seasonings and other foods  Onion salt, garlic salt, seasoned salt, table salt, and sea salt. Canned and packaged gravies. Worcestershire sauce. Tartar sauce. Barbecue sauce. Teriyaki sauce. Soy sauce, including reduced-sodium. Steak sauce. Fish sauce. Oyster sauce. Cocktail sauce. Horseradish that you find on the shelf. Regular ketchup and mustard. Meat flavorings and tenderizers. Bouillon cubes. Hot sauce and Tabasco sauce. Premade or packaged marinades. Premade or packaged taco seasonings. Relishes. Regular salad dressings. Salsa. Potato and tortilla chips. Corn chips and puffs. Salted popcorn and pretzels. Canned or dried soups. Pizza.  Frozen entrees and pot pies. Summary  Eating less sodium can help lower your blood pressure, reduce swelling, and protect your heart, liver, and kidneys.  Most people on this plan should limit their sodium intake to 1,500-2,000 mg (milligrams) of sodium each day.  Canned, boxed, and frozen foods are high in sodium. Restaurant foods, fast foods, and pizza are also very high in sodium. You also get sodium by adding salt to food.  Try to cook at home, eat more fresh fruits and vegetables, and eat less fast food, canned, processed, or prepared foods. This information is not intended to replace advice given to you by your health care provider. Make sure you discuss any questions you have with your health care provider. Document Released: 01/16/2002 Document Revised: 07/20/2016 Document Reviewed: 07/20/2016 Elsevier Interactive Patient Education  2017 [employer].  -

## 2016-12-10 LAB — BASIC METABOLIC PANEL
BUN/Creatinine Ratio: 15 (ref 10–24)
BUN: 20 mg/dL (ref 8–27)
CALCIUM: 9.4 mg/dL (ref 8.6–10.2)
CHLORIDE: 105 mmol/L (ref 96–106)
CO2: 21 mmol/L (ref 18–29)
Creatinine, Ser: 1.35 mg/dL — ABNORMAL HIGH (ref 0.76–1.27)
GFR, EST AFRICAN AMERICAN: 57 mL/min/{1.73_m2} — AB (ref >59)
GFR, EST NON AFRICAN AMERICAN: 49 mL/min/{1.73_m2} — AB (ref >59)
Glucose: 109 mg/dL — ABNORMAL HIGH (ref 65–99)
Potassium: 4.3 mmol/L (ref 3.5–5.2)
Sodium: 143 mmol/L (ref 134–144)

## 2016-12-15 ENCOUNTER — Other Ambulatory Visit: Payer: Self-pay | Admitting: Internal Medicine

## 2016-12-15 DIAGNOSIS — M170 Bilateral primary osteoarthritis of knee: Secondary | ICD-10-CM

## 2016-12-24 ENCOUNTER — Encounter: Payer: Self-pay | Admitting: Internal Medicine

## 2016-12-25 ENCOUNTER — Encounter: Payer: Self-pay | Admitting: Internal Medicine

## 2016-12-28 ENCOUNTER — Encounter: Payer: Self-pay | Admitting: Internal Medicine

## 2017-01-08 NOTE — Addendum Note (Signed)
Addendum  created 01/08/17 1308 by Rica Koyanagi, MD   Sign clinical note

## 2017-01-24 ENCOUNTER — Encounter (HOSPITAL_BASED_OUTPATIENT_CLINIC_OR_DEPARTMENT_OTHER): Payer: Self-pay

## 2017-01-24 ENCOUNTER — Emergency Department (HOSPITAL_BASED_OUTPATIENT_CLINIC_OR_DEPARTMENT_OTHER)
Admission: EM | Admit: 2017-01-24 | Discharge: 2017-01-25 | Disposition: A | Discharge status: Home / Self Care | Payer: Self-pay | Attending: Emergency Medicine | Admitting: Emergency Medicine

## 2017-01-24 ENCOUNTER — Emergency Department (HOSPITAL_BASED_OUTPATIENT_CLINIC_OR_DEPARTMENT_OTHER): Payer: Self-pay

## 2017-01-24 DIAGNOSIS — R109 Unspecified abdominal pain: Secondary | ICD-10-CM

## 2017-01-24 DIAGNOSIS — Z79899 Other long term (current) drug therapy: Secondary | ICD-10-CM | POA: Insufficient documentation

## 2017-01-24 DIAGNOSIS — Z7982 Long term (current) use of aspirin: Secondary | ICD-10-CM | POA: Insufficient documentation

## 2017-01-24 DIAGNOSIS — R1031 Right lower quadrant pain: Secondary | ICD-10-CM | POA: Insufficient documentation

## 2017-01-24 LAB — CBC WITH DIFFERENTIAL/PLATELET
BASOS PCT: 0 %
Basophils Absolute: 0 10*3/uL (ref 0.0–0.1)
Eosinophils Absolute: 0.1 10*3/uL (ref 0.0–0.7)
Eosinophils Relative: 1 %
HEMATOCRIT: 40.3 % (ref 39.0–52.0)
Hemoglobin: 13.7 g/dL (ref 13.0–17.0)
LYMPHS ABS: 1.4 10*3/uL (ref 0.7–4.0)
LYMPHS PCT: 16 %
MCH: 30.2 pg (ref 26.0–34.0)
MCHC: 34 g/dL (ref 30.0–36.0)
MCV: 88.8 fL (ref 78.0–100.0)
MONOS PCT: 11 %
Monocytes Absolute: 1 10*3/uL (ref 0.1–1.0)
NEUTROS ABS: 6.3 10*3/uL (ref 1.7–7.7)
Neutrophils Relative %: 72 %
Platelets: 124 10*3/uL — ABNORMAL LOW (ref 150–400)
RBC: 4.54 MIL/uL (ref 4.22–5.81)
RDW: 14 % (ref 11.5–15.5)
WBC: 8.8 10*3/uL (ref 4.0–10.5)

## 2017-01-24 LAB — COMPREHENSIVE METABOLIC PANEL
ALK PHOS: 66 U/L (ref 38–126)
ALT: 20 U/L (ref 17–63)
ANION GAP: 8 (ref 5–15)
AST: 14 U/L — ABNORMAL LOW (ref 15–41)
Albumin: 3.1 g/dL — ABNORMAL LOW (ref 3.5–5.0)
BILIRUBIN TOTAL: 1.3 mg/dL — AB (ref 0.3–1.2)
BUN: 17 mg/dL (ref 6–20)
CALCIUM: 8.7 mg/dL — AB (ref 8.9–10.3)
CO2: 26 mmol/L (ref 22–32)
Chloride: 103 mmol/L (ref 101–111)
Creatinine, Ser: 1.15 mg/dL (ref 0.61–1.24)
GFR, EST NON AFRICAN AMERICAN: 58 mL/min — AB (ref >60)
GLUCOSE: 101 mg/dL — AB (ref 65–99)
POTASSIUM: 3.8 mmol/L (ref 3.5–5.1)
Sodium: 137 mmol/L (ref 135–145)
TOTAL PROTEIN: 6.9 g/dL (ref 6.5–8.1)

## 2017-01-24 LAB — URINALYSIS, ROUTINE W REFLEX MICROSCOPIC
BILIRUBIN URINE: NEGATIVE
Glucose, UA: NEGATIVE mg/dL
Hgb urine dipstick: NEGATIVE
KETONES UR: NEGATIVE mg/dL
Leukocytes, UA: NEGATIVE
NITRITE: NEGATIVE
PH: 8 (ref 5.0–8.0)
PROTEIN: NEGATIVE mg/dL
Specific Gravity, Urine: 1.013 (ref 1.005–1.030)

## 2017-01-24 LAB — LIPASE, BLOOD: LIPASE: 17 U/L (ref 11–51)

## 2017-01-24 MED ORDER — FENTANYL CITRATE (PF) 100 MCG/2ML IJ SOLN
50.0000 ug | INTRAMUSCULAR | Status: DC | PRN
  Administered 2017-01-24: 50 ug via INTRAVENOUS
  Filled 2017-01-24: qty 2

## 2017-01-24 MED ORDER — METHOCARBAMOL 500 MG PO TABS: 500.0000 mg | ORAL_TABLET | Freq: Every evening | ORAL | 0 refills | Status: DC | PRN

## 2017-01-24 NOTE — ED Provider Notes (Signed)
[facility] Provider Note   CSN: [id number] Arrival date & time: 01/24/17  1900   By signing my name below, I, Eunice Blase, attest that this documentation has been prepared under the direction and in the presence of Jeweldean Drohan, Grayce Sessions, MD. Electronically signed, Eunice Blase, ED Scribe. 01/24/17. 7:41 PM.   History   Chief Complaint Chief Complaint  Patient presents with  . Abdominal Pain   The history is provided by the patient, a relative and medical records. The history is limited by a language barrier. A language interpreter was used.    Even with interpreter, history was limited due to pt's hearing impairment.  James Ayers is a 83 y.o. male presenting to the Emergency Department with chief complaint of R sided flank pain onset today. Pain worse with deep breathing and movement. He states sitting, standing and moving side to side while laying down specifically worsen his pain.No other alleviating or aggravating factors. He describes 10/10, pressure like, burning pain. H/o BPH noted; h/o kidney stone repaired surgically reportedly in 1973. Colonoscopy noted 11/04/2016. No h/o similar pain. No dysuria, vomiting, acute diarrhea or chest pain. No other complaints at this time.   Past Medical History:  Diagnosis Date  . BPH (benign prostatic hyperplasia)   . GERD (gastroesophageal reflux disease)   . Glaucoma   . Hypertension   . Vitamin D deficiency     Patient Active Problem List   Diagnosis Date Noted  . Language barrier 12/09/2016  . Bacterial infection due to H. pylori 12/09/2016  . Abdominal tenderness, left lower quadrant 11/05/2016  . Gastroesophageal reflux disease 11/05/2016  . Hemorrhage of rectum and anus 11/05/2016  . HTN (hypertension), benign 08/25/2016  . Benign prostatic hyperplasia with lower urinary tract symptoms 08/25/2016  . Chronic midline low back pain 08/25/2016    Past Surgical History:  Procedure Laterality Date  . CATARACT  EXTRACTION    . COLONOSCOPY WITH PROPOFOL N/A 11/05/2016   Procedure: COLONOSCOPY WITH PROPOFOL;  Surgeon: Wilford Corner, MD;  Location: [facility];  Service: Endoscopy;  Laterality: N/A;  . ESOPHAGOGASTRODUODENOSCOPY (EGD) WITH PROPOFOL N/A 11/05/2016   Procedure: ESOPHAGOGASTRODUODENOSCOPY (EGD) WITH PROPOFOL;  Surgeon: Wilford Corner, MD;  Location: [facility];  Service: Endoscopy;  Laterality: N/A;  . EYE SURGERY Bilateral 2007       Home Medications    Prior to Admission medications   Medication Sig Start Date End Date Taking? Authorizing Provider  acetaminophen (TYLENOL) 325 MG tablet Take 650 mg by mouth every 6 (six) hours as needed for moderate pain or headache.    [provider]  amLODipine (NORVASC) 5 MG tablet Take 1 tablet (5 mg total) by mouth daily. 12/09/16   Maren Reamer, MD  aspirin EC 81 MG tablet Take 81 mg by mouth daily.    [provider]  brimonidine (ALPHAGAN) 0.2 % ophthalmic solution Place 1 drop into both eyes 2 (two) times daily.     [provider]  clarithromycin (BIAXIN) 500 MG tablet Take 2 tablets (1,000 mg total) by mouth 2 (two) times daily. 12/09/16   Maren Reamer, MD  diclofenac sodium (VOLTAREN) 1 % GEL Apply 2 g topically 4 (four) times daily. 12/09/16   Langeland, Dawn T, MD  dorzolamide (TRUSOPT) 2 % ophthalmic solution Place 1 drop into both eyes 2 (two) times daily.    [provider]  doxazosin (CARDURA) 4 MG tablet Take 4 mg by mouth at bedtime.     [provider]  finasteride (PROSCAR) 5 MG tablet Take 5 mg by mouth at bedtime.     [provider]  methocarbamol (ROBAXIN) 500 MG tablet Take 1 tablet (500 mg total) by mouth at bedtime as needed for muscle spasms. 01/24/17   Donatello Kleve, Grayce Sessions, MD  metroNIDAZOLE (FLAGYL) 500 MG tablet Take 1 tablet (500 mg total) by mouth 2 (two) times daily. 12/09/16   Maren Reamer, MD  pantoprazole (PROTONIX) 40 MG tablet Take 1 tablet (40  mg total) by mouth 2 (two) times daily. 12/09/16   Maren Reamer, MD  prednisoLONE acetate (PRED FORTE) 1 % ophthalmic suspension Place 1 drop into the left eye 2 (two) times daily.     [provider]  Travoprost, BAK Free, (TRAVATAN) 0.004 % SOLN ophthalmic solution Place 1 drop into both eyes at bedtime.     [provider]    Family History No family history on file.  Social History Social History  Substance Use Topics  . Smoking status: Never Smoker  . Smokeless tobacco: Never Used  . Alcohol use No     Allergies   Penicillins   Review of Systems Review of Systems All other systems reviewed and all systems are negative for acute changes except as noted in the HPI and PMH.    Physical Exam Updated Vital Signs BP (!) 142/85 (BP Location: Right Arm)   Pulse 99   Temp 99.2 F (37.3 C) (Oral)   Resp 19   Ht 6\' 1"  (1.854 m)   Wt 201 lb (91.2 kg)   SpO2 97%   BMI 26.52 kg/m   Physical Exam  Constitutional: He is oriented to person, place, and time. He appears well-developed and well-nourished. No distress.  HENT:  Head: Normocephalic and atraumatic.  Nose: Nose normal.  Eyes: Conjunctivae and EOM are normal. Pupils are equal, round, and reactive to light. Right eye exhibits no discharge. Left eye exhibits no discharge. No scleral icterus.  Neck: Normal range of motion. Neck supple.  Cardiovascular: Normal rate and regular rhythm.  Exam reveals no gallop and no friction rub.   No murmur heard. Pulmonary/Chest: Effort normal and breath sounds normal. No stridor. No respiratory distress. He has no rales.  Abdominal: Soft. He exhibits no distension. There is tenderness (right flank). There is no rigidity, no rebound, no guarding, no CVA tenderness and negative Murphy's sign.  Musculoskeletal: He exhibits no edema or tenderness.  Neurological: He is alert and oriented to person, place, and time.  Skin: Skin is warm and dry. No rash noted. He is not  diaphoretic. No erythema.  Psychiatric: He has a normal mood and affect.  Vitals reviewed.    ED Treatments / Results  DIAGNOSTIC STUDIES: Oxygen Saturation is 97% on RA, NL by my interpretation.    COORDINATION OF CARE: 7:41 PM-Discussed next steps with pt. Pt verbalized understanding and is agreeable with the plan. Will order imaging and blood work.   Labs (all labs ordered are listed, but only abnormal results are displayed) Labs Reviewed  CBC WITH DIFFERENTIAL/PLATELET - Abnormal; Notable for the following:       Result Value   Platelets 124 (*)    All other components within normal limits  COMPREHENSIVE METABOLIC PANEL - Abnormal; Notable for the following:    Glucose, Bld 101 (*)    Calcium 8.7 (*)    Albumin 3.1 (*)    AST 14 (*)    Total Bilirubin 1.3 (*)    GFR calc non  Af Amer 58 (*)    All other components within normal limits  LIPASE, BLOOD  URINALYSIS, ROUTINE W REFLEX MICROSCOPIC    EKG  EKG Interpretation None       Radiology Ct Abdomen Pelvis Wo Contrast  Result Date: 01/24/2017 CLINICAL DATA:  83 year old male with right flank pain. EXAM: CT ABDOMEN AND PELVIS WITHOUT CONTRAST TECHNIQUE: Multidetector CT imaging of the abdomen and pelvis was performed following the standard protocol without IV contrast. COMPARISON:  None. FINDINGS: Evaluation of this exam is limited in the absence of intravenous contrast. Lower chest: Bibasilar interstitial and parenchymal densities, likely atelectatic changes/scarring. Developing pneumonia is not entirely excluded. Clinical correlation is recommended. There is mild cardiomegaly. No intra-abdominal free air or free fluid. Hepatobiliary: The liver is unremarkable. No intrahepatic biliary ductal dilatation. The gallbladder is unremarkable as well. No calcified gallstone. Pancreas: Unremarkable. No pancreatic ductal dilatation or surrounding inflammatory changes. Spleen: Normal in size without focal abnormality.  Adrenals/Urinary Tract: The adrenal glands are unremarkable. Bilateral renal hypodense lesions measuring 2.0 x 1.8 cm in the upper pole of the right kidney. These lesions are incompletely characterized but likely represent cysts. Two 810 x 11 mm high attenuating lesion in the inferior pole of the left kidney is indeterminate but likely represents a complex/ proteinaceous cyst. There is no hydronephrosis or nephrolithiasis on either side. The visualized ureters and urinary bladder appear unremarkable. Stomach/Bowel: There is no evidence of bowel obstruction or active inflammation. Normal appendix. There is protrusion of a short segment of distal small bowel in the right inguinal canal without evidence of obstruction or strangulation. Vascular/Lymphatic: There is mild aortoiliac atherosclerotic disease. No portal venous gas identified. There is no adenopathy. Reproductive: Mildly enlarged prostate gland measuring 4.8 cm in transverse diameter. Other: There is a 2.5 cm peritoneal defect with a fat containing umbilical hernia. Musculoskeletal: Degenerative changes of the spine. No acute fracture. IMPRESSION: 1. No acute intra-abdominopelvic pathology. No hydronephrosis or nephrolithiasis. 2. Bilateral renal lesions, incompletely characterized but may represent cysts. Further evaluation with nonemergent ultrasound or MRI is recommended. 3. Right inguinal hernia containing a short segment of small bowel. No evidence of obstruction or strangulation. Normal appendix. 4.  Aortic Atherosclerosis (ICD10-I70.0). 5. Bibasilar atelectasis/scarring. Pneumonia is less likely. Clinical correlation is recommended. Electronically Signed   By: Anner Crete M.D.   On: 01/24/2017 22:51    Procedures Procedures (including critical care time)  Medications Ordered in ED Medications  fentaNYL (SUBLIMAZE) injection 50 mcg (50 mcg Intravenous Given 01/24/17 2133)     Initial Impression / Assessment and Plan / ED Course  I have  reviewed the triage vital signs and the nursing notes.  Pertinent labs & imaging results that were available during my care of the patient were reviewed by me and considered in my medical decision making (see chart for details).     Given difficulty in obtaining history, his extensive workup was performed including labs and imaging which were all grossly reassuring. No evidence of urinary tract infection or hematuria. Labs without leukocytosis, evidence of pancreatitis or biliary obstruction. CT scan without evidence of renal calculi, hydronephrosis, small bowel obstruction, other serious intra-abdominal inflammatory/infectious processes.  No emergent etiology is identified during workup. Possibly MSK in nature.   The patient is safe for discharge with strict return precautions.  Final Clinical Impressions(s) / ED Diagnoses   Final diagnoses:  Right flank pain   Disposition: Discharge  Condition: Good  I have discussed the results, Dx and Tx plan with the patient  and son who expressed understanding and agree(s) with the plan. Discharge instructions discussed at great length. The patient and son were given strict return precautions who verbalized understanding of the instructions. No further questions at time of discharge.    New Prescriptions   METHOCARBAMOL (ROBAXIN) 500 MG TABLET    Take 1 tablet (500 mg total) by mouth at bedtime as needed for muscle spasms.    Follow Up: Maren Reamer, MD  Schedule an appointment as soon as possible for a visit  in 3-5 days, If symptoms do not improve or  worsen   I personally performed the services described in this documentation, which was scribed in my presence. The recorded information has been reviewed and is accurate.        Fatima Blank, MD 01/24/17 [phone]

## 2017-01-24 NOTE — ED Notes (Signed)
EDP at bedside. Arabic interpreter called ([id number]) to interview pt. Pt reports R sided abd pain since last night, worsening today. Pt reports difficulty turning when laying flat, or taking a deep breath. Also reports BIL LE edema L>R. States his abd pain is worse with breathing. Reports urinary incontinence, but denies dysuria. Denies n/v, but reports diarrhea "all the time". Pt poor historian, even with interpreter.

## 2017-01-24 NOTE — ED Notes (Signed)
IV attempt x 1 without success.

## 2017-01-24 NOTE — ED Notes (Signed)
Pt given urinal to attempt a urine sample.

## 2017-01-24 NOTE — ED Triage Notes (Signed)
Reports "side pain".  Points from top of right side to bottom of right side.  Does not point to chest.  Reports it hurts to take a deep breath.

## 2017-01-25 MED FILL — DICLOFENAC SODIUM 1% GEL: 1 | 12 days supply | Qty: 100 | Fill #1

## 2017-07-26 IMAGING — CT CT ABD-PELV W/O CM
2 of 4 series · 15 of 46 positions shown, 17 images · non-contrast
Comparison: None.

CLINICAL DATA: 83-year-old male with right flank pain.

EXAM:
CT ABDOMEN AND PELVIS WITHOUT CONTRAST
TECHNIQUE: Multidetector CT imaging of the abdomen and pelvis was performed
following the standard protocol without IV contrast.

[Series 2: axial st · axial · 0.82mm/px · z∈[-384,+70]mm · 12 of 105 slices shown, 14 images]
[im 9/105  soft-tissue]
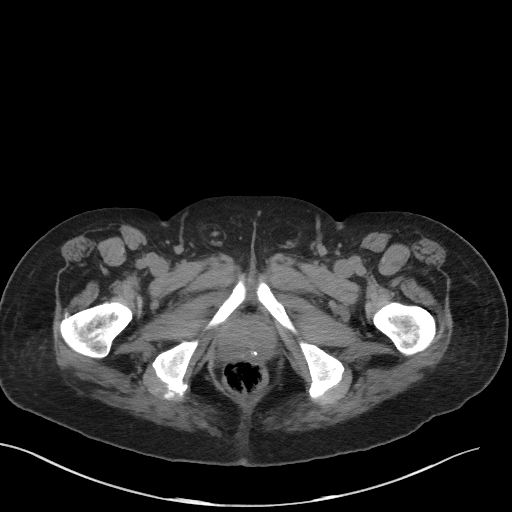
[im 9/105  bone]
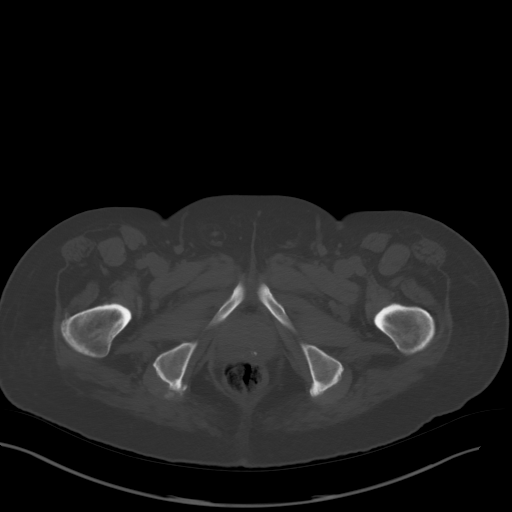
[im 17/105  soft-tissue]
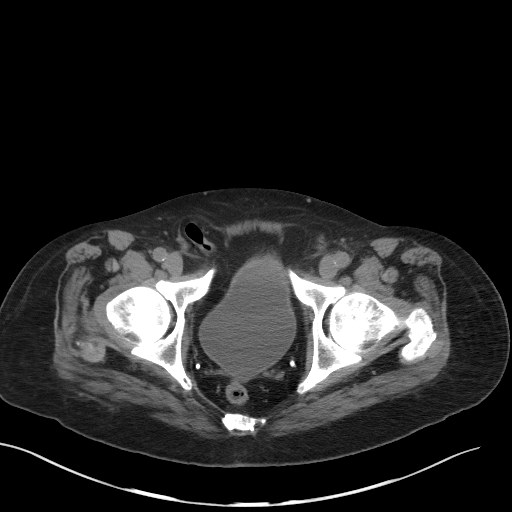
[im 25/105  soft-tissue]
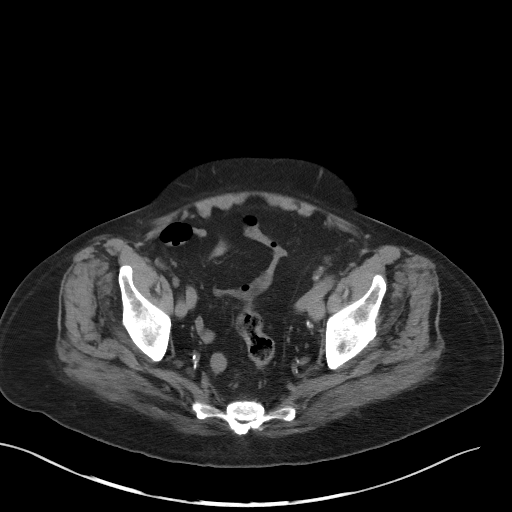
[im 34/105  soft-tissue]
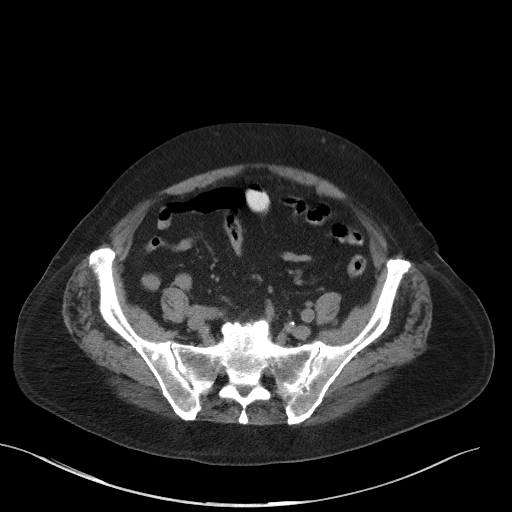
[im 42/105  soft-tissue]
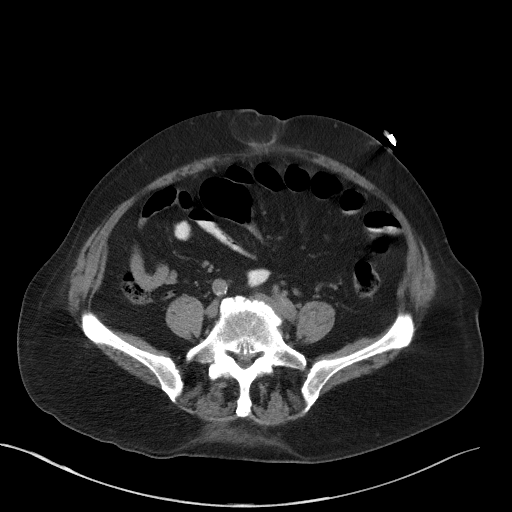
[im 50/105  soft-tissue]
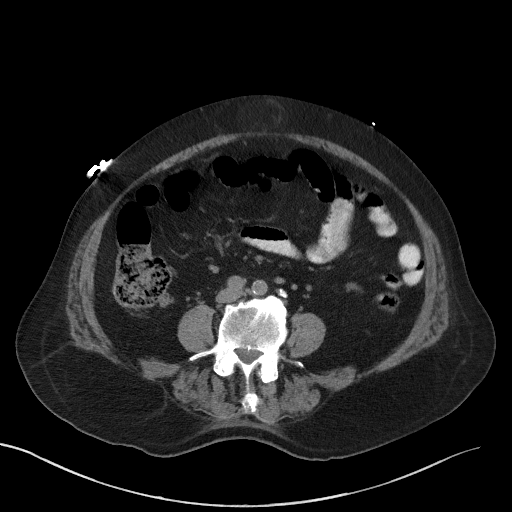
[im 59/105  soft-tissue]
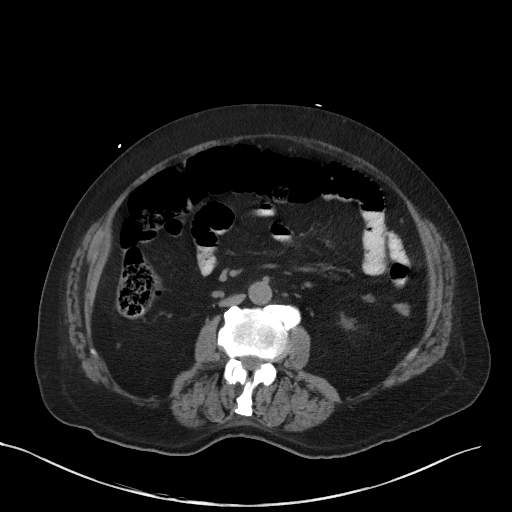
[im 67/105  soft-tissue]
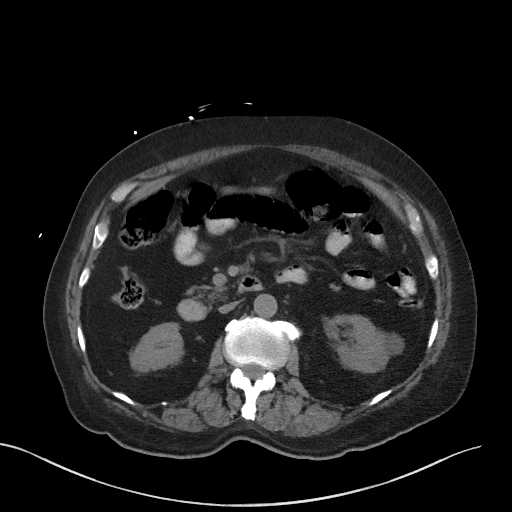
[im 75/105  soft-tissue]
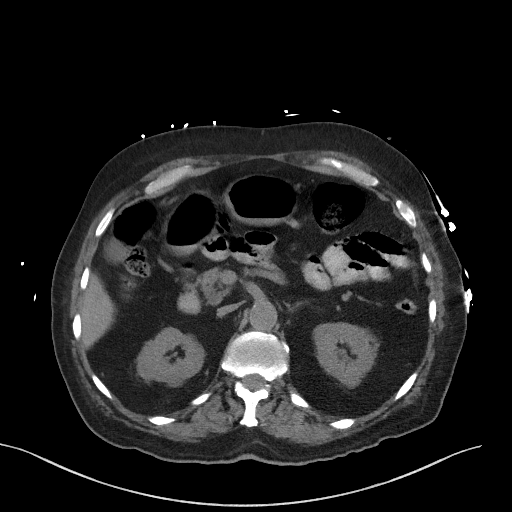
[im 75/105  bone]
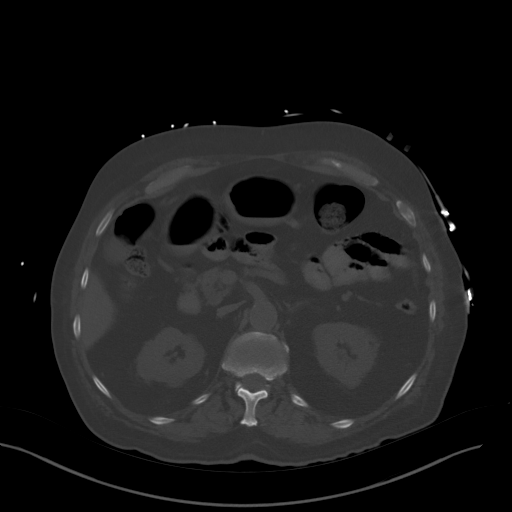
[im 84/105  soft-tissue]
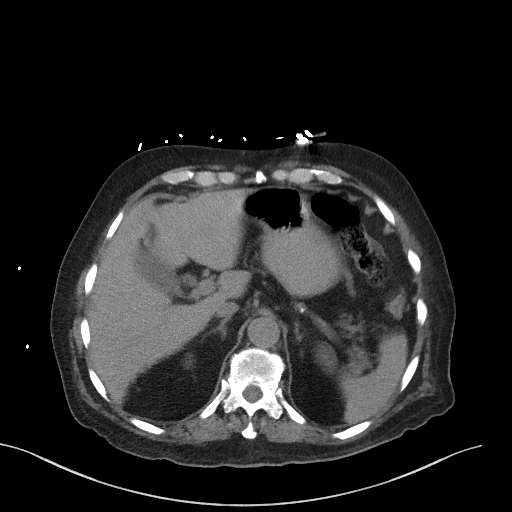
[im 92/105  soft-tissue]
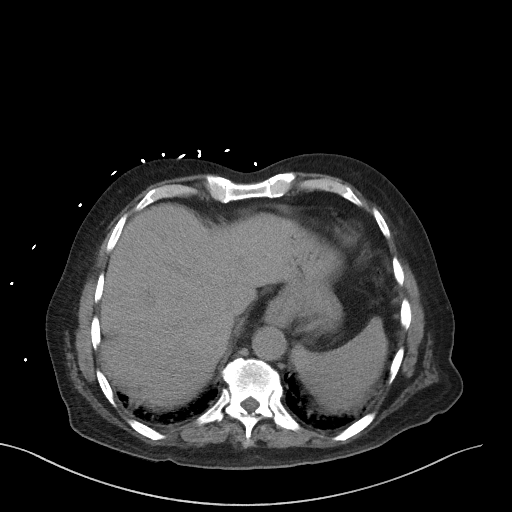
[im 100/105  soft-tissue]
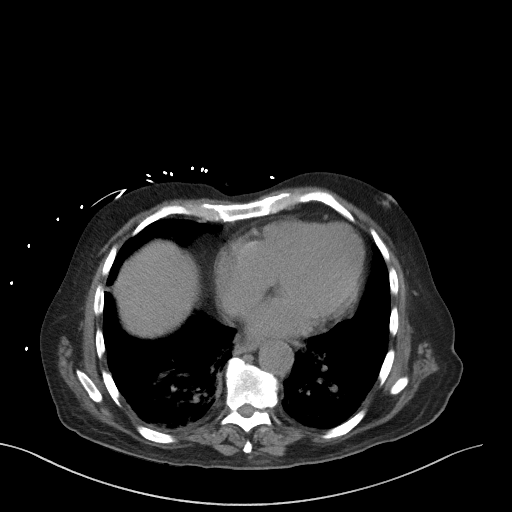

[Series 5: coronal st · coronal · 0.75mm/px · 3 of 108 slices shown]
[im 36/108  soft-tissue]
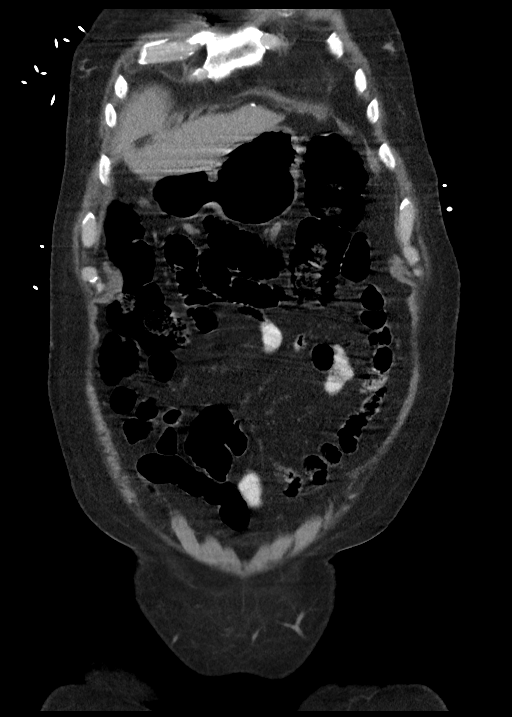
[im 48/108  soft-tissue]
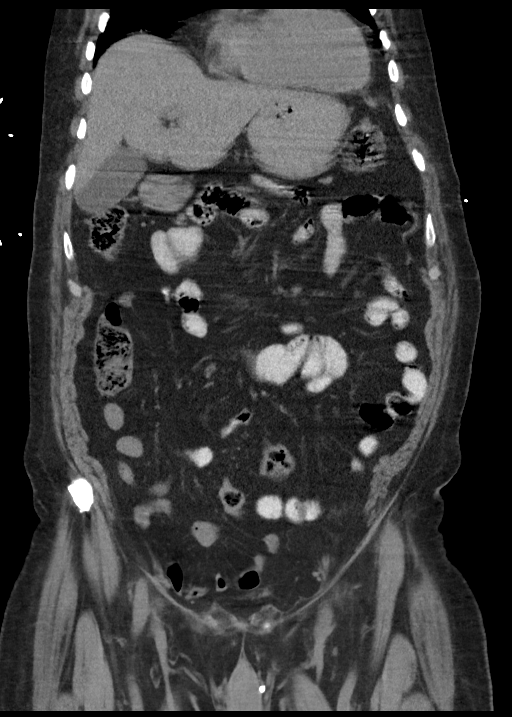
[im 60/108  soft-tissue]
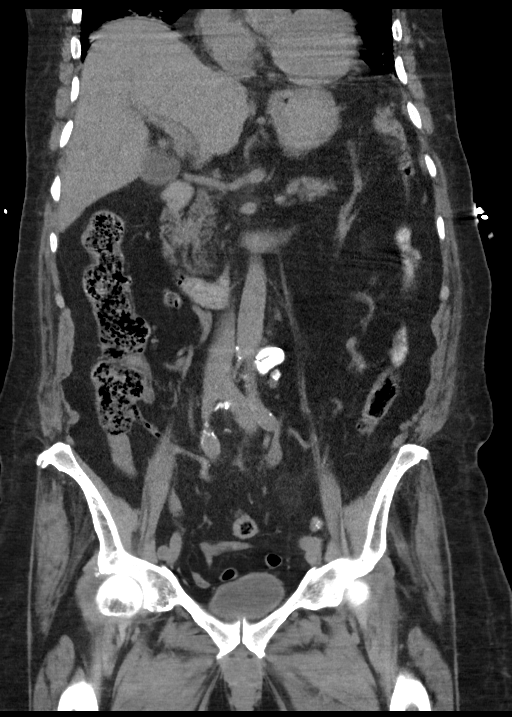

[15 of 46 positions shown; findings below may reference images not displayed]

FINDINGS: Evaluation of this exam is limited in the absence of intravenous
contrast.

Lower chest: Bibasilar interstitial and parenchymal densities,
likely atelectatic changes/scarring. Developing pneumonia is not
entirely excluded. Clinical correlation is recommended. There is
mild cardiomegaly.

No intra-abdominal free air or free fluid.

Hepatobiliary: The liver is unremarkable. No intrahepatic biliary
ductal dilatation. The gallbladder is unremarkable as well. No
calcified gallstone.

Pancreas: Unremarkable. No pancreatic ductal dilatation or
surrounding inflammatory changes.

Spleen: Normal in size without focal abnormality.

Adrenals/Urinary Tract: The adrenal glands are unremarkable.
Bilateral renal hypodense lesions measuring 2.0 x 1.8 cm in the
upper pole of the right kidney. These lesions are incompletely
characterized but likely represent cysts. Two 810 x 11 mm high
attenuating lesion in the inferior pole of the left kidney is
indeterminate but likely represents a complex/ proteinaceous cyst.
There is no hydronephrosis or nephrolithiasis on either side. The
visualized ureters and urinary bladder appear unremarkable.

Stomach/Bowel: There is no evidence of bowel obstruction or active
inflammation. Normal appendix. There is protrusion of a short
segment of distal small bowel in the right inguinal canal without
evidence of obstruction or strangulation.

Vascular/Lymphatic: There is mild aortoiliac atherosclerotic
disease. No portal venous gas identified. There is no adenopathy.

Reproductive: Mildly enlarged prostate gland measuring 4.8 cm in
transverse diameter.

Other: There is a 2.5 cm peritoneal defect with a fat containing
umbilical hernia.

Musculoskeletal: Degenerative changes of the spine. No acute
fracture.
IMPRESSION: 1. No acute intra-abdominopelvic pathology. No hydronephrosis or
nephrolithiasis.
2. Bilateral renal lesions, incompletely characterized but may
represent cysts. Further evaluation with nonemergent ultrasound or
MRI is recommended.
3. Right inguinal hernia containing a short segment of small bowel.
No evidence of obstruction or strangulation. Normal appendix.
4.  Aortic Atherosclerosis ([id number]).
5. Bibasilar atelectasis/scarring. Pneumonia is less likely.
Clinical correlation is recommended.

## 2018-02-21 ENCOUNTER — Ambulatory Visit: Payer: Self-pay | Admitting: Internal Medicine

## 2018-03-21 ENCOUNTER — Ambulatory Visit: Payer: Self-pay | Attending: Nurse Practitioner | Admitting: Nurse Practitioner

## 2018-03-21 ENCOUNTER — Encounter: Payer: Self-pay | Admitting: Nurse Practitioner

## 2018-03-21 VITALS — BP 186/80 | HR 65 | Temp 98.2°F | Ht 74.0 in | Wt 197.0 lb

## 2018-03-21 DIAGNOSIS — M159 Polyosteoarthritis, unspecified: Secondary | ICD-10-CM

## 2018-03-21 DIAGNOSIS — H409 Unspecified glaucoma: Secondary | ICD-10-CM | POA: Insufficient documentation

## 2018-03-21 DIAGNOSIS — Z880 Allergy status to penicillin: Secondary | ICD-10-CM | POA: Insufficient documentation

## 2018-03-21 DIAGNOSIS — Z7982 Long term (current) use of aspirin: Secondary | ICD-10-CM | POA: Insufficient documentation

## 2018-03-21 DIAGNOSIS — R7989 Other specified abnormal findings of blood chemistry: Secondary | ICD-10-CM

## 2018-03-21 DIAGNOSIS — M170 Bilateral primary osteoarthritis of knee: Secondary | ICD-10-CM | POA: Insufficient documentation

## 2018-03-21 DIAGNOSIS — Z79899 Other long term (current) drug therapy: Secondary | ICD-10-CM | POA: Insufficient documentation

## 2018-03-21 DIAGNOSIS — E559 Vitamin D deficiency, unspecified: Secondary | ICD-10-CM | POA: Insufficient documentation

## 2018-03-21 DIAGNOSIS — I10 Essential (primary) hypertension: Secondary | ICD-10-CM | POA: Insufficient documentation

## 2018-03-21 DIAGNOSIS — N401 Enlarged prostate with lower urinary tract symptoms: Secondary | ICD-10-CM | POA: Insufficient documentation

## 2018-03-21 DIAGNOSIS — R946 Abnormal results of thyroid function studies: Secondary | ICD-10-CM | POA: Insufficient documentation

## 2018-03-21 DIAGNOSIS — N138 Other obstructive and reflux uropathy: Secondary | ICD-10-CM | POA: Insufficient documentation

## 2018-03-21 MED ORDER — LISINOPRIL 5 MG PO TABS: 5.0000 mg | ORAL_TABLET | Freq: Every day | ORAL | 3 refills | Status: DC

## 2018-03-21 MED ORDER — FINASTERIDE 5 MG PO TABS: 5.0000 mg | ORAL_TABLET | Freq: Every day | ORAL | 2 refills | Status: DC

## 2018-03-21 MED ORDER — DICLOFENAC SODIUM 1 % TD GEL: 2.0000 g | Freq: Four times a day (QID) | TRANSDERMAL | 2 refills | Status: DC

## 2018-03-21 MED ORDER — LISINOPRIL 10 MG PO TABS: 10.0000 mg | ORAL_TABLET | Freq: Every day | ORAL | 1 refills | Status: DC

## 2018-03-21 MED ORDER — DOXAZOSIN MESYLATE 4 MG PO TABS: 4.0000 mg | ORAL_TABLET | Freq: Every day | ORAL | 2 refills | Status: DC

## 2018-03-21 MED ORDER — LISINOPRIL 5 MG PO TABS: 5.0000 mg | ORAL_TABLET | Freq: Every day | ORAL | 1 refills | Status: DC

## 2018-03-21 MED FILL — LISINOPRIL 5 MG TAB: 5 | 30 days supply | Qty: 30 | Fill #0

## 2018-03-21 NOTE — Patient Instructions (Signed)
Edema Edema is when you have too much fluid in your body or under your skin. Edema may make your legs, feet, and ankles swell up. Swelling is also common in looser tissues, like around your eyes. This is a common condition. It gets more common as you get older. There are many possible causes of edema. Eating too much salt (sodium) and being on your feet or sitting for a long time can cause edema in your legs, feet, and ankles. Hot weather may make edema worse. Edema is usually painless. Your skin may look swollen or shiny. Follow these instructions at home:  Keep the swollen body part raised (elevated) above the level of your heart when you are sitting or lying down.  Do not sit still or stand for a long time.  Do not wear tight clothes. Do not wear garters on your upper legs.  Exercise your legs. This can help the swelling go down.  Wear elastic bandages or support stockings as told by your doctor.  Eat a low-salt (low-sodium) diet to reduce fluid as told by your doctor.  Depending on the cause of your swelling, you may need to limit how much fluid you drink (fluid restriction).  Take over-the-counter and prescription medicines only as told by your doctor. Contact a doctor if:  Treatment is not working.  You have heart, liver, or kidney disease and have symptoms of edema.  You have sudden and unexplained weight gain. Get help right away if:  You have shortness of breath or chest pain.  You cannot breathe when you lie down.  You have pain, redness, or warmth in the swollen areas.  You have heart, liver, or kidney disease and get edema all of a sudden.  You have a fever and your symptoms get worse all of a sudden. Summary  Edema is when you have too much fluid in your body or under your skin.  Edema may make your legs, feet, and ankles swell up. Swelling is also common in looser tissues, like around your eyes.  Raise (elevate) the swollen body part above the level of your  heart when you are sitting or lying down.  Follow your doctor's instructions about diet and how much fluid you can drink (fluid restriction). This information is not intended to replace advice given to you by your health care provider. Make sure you discuss any questions you have with your health care provider. Document Released: 01/13/2008 Document Revised: 08/14/2016 Document Reviewed: 08/14/2016 Elsevier Interactive Patient Education  2017 [employer]  Peripheral Edema Peripheral edema is swelling that is caused by a buildup of fluid. Peripheral edema most often affects the lower legs, ankles, and feet. It can also develop in the arms, hands, and face. The area of the body that has peripheral edema will look swollen. It may also feel heavy or warm. Your clothes may start to feel tight. Pressing on the area may make a temporary dent in your skin. You may not be able to move your arm or leg as much as usual. There are many causes of peripheral edema. It can be a complication of other diseases, such as congestive heart failure, kidney disease, or a problem with your blood circulation. It also can be a side effect of certain medicines. It often happens to women during pregnancy. Sometimes, the cause is not known. Treating the underlying condition is often the only treatment for peripheral edema. Follow these instructions at home: Pay attention to any changes in your symptoms. Take  these actions to help with your discomfort:  Raise (elevate) your legs while you are sitting or lying down.  Move around often to prevent stiffness and to lessen swelling. Do not sit or stand for long periods of time.  Wear support stockings as told by your health care provider.  Follow instructions from your health care provider about limiting salt (sodium) in your diet. Sometimes eating less salt can reduce swelling.  Take over-the-counter and prescription medicines only as told by your health care provider. Your  health care provider may prescribe medicine to help your body get rid of excess water (diuretic).  Keep all follow-up visits as told by your health care provider. This is important.  Contact a health care provider if:  You have a fever.  Your edema starts suddenly or is getting worse, especially if you are pregnant or have a medical condition.  You have swelling in only one leg.  You have increased swelling and pain in your legs. Get help right away if:  You develop shortness of breath, especially when you are lying down.  You have pain in your chest or abdomen.  You feel weak.  You faint. This information is not intended to replace advice given to you by your health care provider. Make sure you discuss any questions you have with your health care provider. Document Released: 09/03/2004 Document Revised: 12/30/2015 Document Reviewed: 02/06/2015 Elsevier Interactive Patient Education  [employer].

## 2018-03-21 NOTE — Progress Notes (Signed)
Assessment & Plan:  James Ayers was seen today for establish care.  Diagnoses and all orders for this visit:  Essential hypertension -     CBC -     CMP14+EGFR -     Lipid panel -     lisinopril (PRINIVIL,ZESTRIL) 5 MG tablet; Take 1 tablet (5 mg total) by mouth daily. Continue all antihypertensives as prescribed.  Remember to bring in your blood pressure log with you for your follow up appointment.  DASH/Mediterranean Diets are healthier choices for HTN.   Abnormal TSH -     T3 -     T4, free -     TSH  Vitamin D insufficiency -     VITAMIN D 25 Hydroxy (Vit-D Deficiency, Fractures)  Generalized OA -     diclofenac sodium (VOLTAREN) 1 % GEL; Apply 2 g topically 4 (four) times daily.  BPH with obstruction/lower urinary tract symptoms -     finasteride (PROSCAR) 5 MG tablet; Take 1 tablet (5 mg total) by mouth at bedtime. -     doxazosin (CARDURA) 4 MG tablet; Take 1 tablet (4 mg total) by mouth at bedtime.    Patient has been counseled on age-appropriate routine health concerns for screening and prevention. These are reviewed and up-to-date. Referrals have been placed accordingly. Immunizations are up-to-date or declined.    Subjective:   Chief Complaint  Patient presents with  . Establish Care    Pt. is here to establish care. Pt. stated he still have on both of his leg and it feels heavy.    HPI James Ayers 84 y.o. male presents to office today to establish care. He is accompanied by his daughter in law who is translating for him today.  Essential Hypertension He has a history of HTN and was previously prescribed amlodipine 54m however his daughter in law states he was experiencing postural hypotension and amlodipine was stopped. She states his blood pressure is normal at home when monitored however he remains hypertensive here today. Will start lisinopril 514mtoday and recheck BP in a few weeks. He denies chest pain, shortness of breath, palpitations,  lightheadedness, dizziness, headaches or visual disturbances. He does endorse BLE edema which improves with wearing  Compression socks.  Generalized OA He endorses chronic bilateral knee and low back pain. Aggravating factors: standing or sitting for prolonged periods of time, bending, twisting, lifting. Relieving factors: Voltaren gel, acetominophen and napoxen.  [place] need to establish care with a Urologist here. I have recommended that he apply for the financial assistance program. He is currently taking doxazosin and finasteride for his GU symptoms.     Review of Systems  Constitutional: Negative for fever, malaise/fatigue and weight loss.  HENT: Negative.  Negative for nosebleeds.   Eyes: Negative.  Negative for blurred vision, double vision and photophobia.  Respiratory: Negative.  Negative for cough and shortness of breath.   Cardiovascular: Negative.  Negative for chest pain, palpitations and leg swelling.  Gastrointestinal: Positive for heartburn (Stopped taking PPI). Negative for nausea and vomiting.  Genitourinary:       BPH  Musculoskeletal: Positive for joint pain. Negative for myalgias.       SEE HPI  Neurological: Negative.  Negative for dizziness, focal weakness, seizures and headaches.  Psychiatric/Behavioral: Negative.  Negative for suicidal ideas.    Past Medical History:  Diagnosis Date  . BPH (benign prostatic hyperplasia)   . GERD (gastroesophageal reflux disease)   .  Glaucoma   . Hypertension   . Osteoarthritis    both knee  . Vitamin D deficiency     Past Surgical History:  Procedure Laterality Date  . CATARACT EXTRACTION    . COLONOSCOPY WITH PROPOFOL N/A 11/05/2016   Procedure: COLONOSCOPY WITH PROPOFOL;  Surgeon: Wilford Corner, MD;  Location: [facility];  Service: Endoscopy;  Laterality: N/A;  . ESOPHAGOGASTRODUODENOSCOPY (EGD) WITH PROPOFOL N/A 11/05/2016   Procedure: ESOPHAGOGASTRODUODENOSCOPY  (EGD) WITH PROPOFOL;  Surgeon: Wilford Corner, MD;  Location: [facility];  Service: Endoscopy;  Laterality: N/A;  . EYE SURGERY Bilateral 2007    Family History  Problem Relation Age of Onset  . Diabetes Neg Hx     Social History Reviewed with no changes to be made today.   Outpatient Medications Prior to Visit  Medication Sig Dispense Refill  . acetaminophen (TYLENOL) 325 MG tablet Take 650 mg by mouth every 6 (six) hours as needed for moderate pain or headache.    Marland Kitchen aspirin EC 81 MG tablet Take 81 mg by mouth daily.    . brimonidine (ALPHAGAN) 0.2 % ophthalmic solution Place 1 drop into both eyes 2 (two) times daily.     . dorzolamide (TRUSOPT) 2 % ophthalmic solution Place 1 drop into both eyes 2 (two) times daily.    . prednisoLONE acetate (PRED FORTE) 1 % ophthalmic suspension Place 1 drop into the left eye 2 (two) times daily.     . Travoprost, BAK Free, (TRAVATAN) 0.004 % SOLN ophthalmic solution Place 1 drop into both eyes at bedtime.     . diclofenac sodium (VOLTAREN) 1 % GEL Apply 2 g topically 4 (four) times daily. 100 g 2  . doxazosin (CARDURA) 4 MG tablet Take 4 mg by mouth at bedtime.     . finasteride (PROSCAR) 5 MG tablet Take 5 mg by mouth at bedtime.     . pantoprazole (PROTONIX) 40 MG tablet Take 1 tablet (40 mg total) by mouth 2 (two) times daily. (Patient not taking: Reported on 03/21/2018) 30 tablet 0  . amLODipine (NORVASC) 5 MG tablet Take 1 tablet (5 mg total) by mouth daily. (Patient not taking: Reported on 03/21/2018) 30 tablet 3  . clarithromycin (BIAXIN) 500 MG tablet Take 2 tablets (1,000 mg total) by mouth 2 (two) times daily. (Patient not taking: Reported on 03/21/2018) 56 tablet 0  . methocarbamol (ROBAXIN) 500 MG tablet Take 1 tablet (500 mg total) by mouth at bedtime as needed for muscle spasms. (Patient not taking: Reported on 03/21/2018) 20 tablet 0  . metroNIDAZOLE (FLAGYL) 500 MG tablet Take 1 tablet (500 mg total) by mouth 2 (two) times daily.  (Patient not taking: Reported on 03/21/2018) 10 tablet 0   No facility-administered medications prior to visit.     Allergies  Allergen Reactions  . Penicillins Rash    Has patient had a PCN reaction causing immediate rash, facial/tongue/throat swelling, SOB or lightheadedness with hypotension: Yes Has patient had a PCN reaction causing severe rash involving mucus membranes or skin necrosis: No Has patient had a PCN reaction that required hospitalization No Has patient had a PCN reaction occurring within the last 10 years: No If all of the above answers are "NO", then may proceed with Cephalosporin use.        Objective:    BP (!) 186/80 (BP Location: Left Arm, Patient Position: Sitting, Cuff Size: Normal)   Pulse 65   Temp 98.2 F (36.8 C) (Oral)   Ht 6' 2"  (1.88 m)  Wt 197 lb (89.4 kg)   SpO2 98%   BMI 25.29 kg/m  Wt Readings from Last 3 Encounters:  03/21/18 197 lb (89.4 kg)  01/24/17 201 lb (91.2 kg)  12/09/16 201 lb 3.2 oz (91.3 kg)    Physical Exam  Constitutional: He is oriented to person, place, and time. He appears well-developed and well-nourished. He is cooperative.  HENT:  Head: Normocephalic and atraumatic.  Eyes: EOM are normal.  Neck: Normal range of motion.  Cardiovascular: Normal rate, regular rhythm, normal heart sounds and intact distal pulses. Exam reveals no gallop and no friction rub.  No murmur heard. Pulmonary/Chest: Effort normal and breath sounds normal. No tachypnea. No respiratory distress. He has no decreased breath sounds. He has no wheezes. He has no rhonchi. He has no rales. He exhibits no tenderness.  Abdominal: Soft. Bowel sounds are normal.  Musculoskeletal: Normal range of motion. He exhibits no edema.       Right knee: He exhibits swelling.       Left knee: He exhibits swelling.       Right ankle: He exhibits swelling.       Left ankle: He exhibits swelling.       Right foot: There is swelling.       Left foot: There is swelling.    Neurological: He is alert and oriented to person, place, and time. Coordination normal.  Skin: Skin is warm and dry.  Psychiatric: He has a normal mood and affect. His behavior is normal. Judgment and thought content normal.  Nursing note and vitals reviewed.      Patient has been counseled extensively about nutrition and exercise as well as the importance of adherence with medications and regular follow-up. The patient was given clear instructions to go to ER or return to medical center if symptoms don't improve, worsen or new problems develop. The patient verbalized understanding.   Follow-up: Return in about 2 weeks (around 04/04/2018) for BP recheck; Needs appointment with financial representative.Gildardo Pounds, FNP-BC [facility] and [facility] [place], [place]   03/21/2018, 6:56 PM

## 2018-03-22 LAB — LIPID PANEL
Chol/HDL Ratio: 4.1 ratio (ref 0.0–5.0)
Cholesterol, Total: 167 mg/dL (ref 100–199)
HDL: 41 mg/dL (ref >39)
LDL Calculated: 104 mg/dL — ABNORMAL HIGH (ref 0–99)
TRIGLYCERIDES: 111 mg/dL (ref 0–149)
VLDL Cholesterol Cal: 22 mg/dL (ref 5–40)

## 2018-03-22 LAB — CMP14+EGFR
A/G RATIO: 1.3 (ref 1.2–2.2)
ALT: 9 IU/L (ref 0–44)
AST: 8 IU/L (ref 0–40)
Albumin: 4.3 g/dL (ref 3.5–4.7)
Alkaline Phosphatase: 82 IU/L (ref 39–117)
BILIRUBIN TOTAL: 0.7 mg/dL (ref 0.0–1.2)
BUN/Creatinine Ratio: 21 (ref 10–24)
BUN: 30 mg/dL — ABNORMAL HIGH (ref 8–27)
CHLORIDE: 105 mmol/L (ref 96–106)
CO2: 22 mmol/L (ref 20–29)
Calcium: 9.2 mg/dL (ref 8.6–10.2)
Creatinine, Ser: 1.43 mg/dL — ABNORMAL HIGH (ref 0.76–1.27)
GFR calc Af Amer: 52 mL/min/{1.73_m2} — ABNORMAL LOW (ref >59)
GFR calc non Af Amer: 45 mL/min/{1.73_m2} — ABNORMAL LOW (ref >59)
GLOBULIN, TOTAL: 3.4 g/dL (ref 1.5–4.5)
Glucose: 100 mg/dL — ABNORMAL HIGH (ref 65–99)
POTASSIUM: 4.7 mmol/L (ref 3.5–5.2)
SODIUM: 142 mmol/L (ref 134–144)
Total Protein: 7.7 g/dL (ref 6.0–8.5)

## 2018-03-22 LAB — CBC
HEMATOCRIT: 45.8 % (ref 37.5–51.0)
Hemoglobin: 15.3 g/dL (ref 13.0–17.7)
MCH: 30.2 pg (ref 26.6–33.0)
MCHC: 33.4 g/dL (ref 31.5–35.7)
MCV: 90 fL (ref 79–97)
PLATELETS: 200 10*3/uL (ref 150–450)
RBC: 5.07 x10E6/uL (ref 4.14–5.80)
RDW: 13.6 % (ref 12.3–15.4)
WBC: 9 10*3/uL (ref 3.4–10.8)

## 2018-03-22 LAB — T3: T3, Total: 133 ng/dL (ref 71–180)

## 2018-03-22 LAB — VITAMIN D 25 HYDROXY (VIT D DEFICIENCY, FRACTURES): VIT D 25 HYDROXY: 28.5 ng/mL — AB (ref 30.0–100.0)

## 2018-03-22 LAB — TSH: TSH: 0.327 u[IU]/mL — AB (ref 0.450–4.500)

## 2018-03-22 LAB — T4, FREE: Free T4: 1.24 ng/dL (ref 0.82–1.77)

## 2018-03-28 ENCOUNTER — Ambulatory Visit: Payer: Self-pay

## 2018-03-31 ENCOUNTER — Telehealth: Payer: Self-pay

## 2018-03-31 ENCOUNTER — Telehealth: Payer: Self-pay | Admitting: Internal Medicine

## 2018-03-31 ENCOUNTER — Other Ambulatory Visit: Payer: Self-pay | Admitting: Nurse Practitioner

## 2018-03-31 DIAGNOSIS — E0590 Thyrotoxicosis, unspecified without thyrotoxic crisis or storm: Secondary | ICD-10-CM

## 2018-03-31 NOTE — Telephone Encounter (Signed)
-----   Message from Gildardo Pounds, NP sent at 03/31/2018 10:05 AM EDT ----- Thyroid levels are low which is an indicator or hyperthyroidism or overactive thyroid. I will need to refer you to endocrinology which is a specialist in thyroid disease.  Also your kidney function is slightly lower than last year. Please make sure you are drinking at least 48 oz of water per day. Will continue to monitor. Not drinking enough water could contribute to dehydration and dizziness. Vitamin D is also slightly low. You can take 800-1000units of vitamin D daily OTC

## 2018-03-31 NOTE — Telephone Encounter (Signed)
Noted  

## 2018-03-31 NOTE — Telephone Encounter (Signed)
CMA attempt to call patient to inform on lab results.  No answer and left a VM for patient to call back.  If patient call back, please inform:  Thyroid levels are low which is an indicator or hyperthyroidism or overactive thyroid. I will need to refer you to endocrinology which is a specialist in thyroid disease.  Also your kidney function is slightly lower than last year. Please make sure you are drinking at least 48 oz of water per day. Will continue to monitor. Not drinking enough water could contribute to dehydration and dizziness. Vitamin D is also slightly low. You can take 800-1000units of vitamin D daily OTC  A letter will be send out to reach patient.

## 2018-03-31 NOTE — Telephone Encounter (Signed)
Patients daughter called and I informed her that his Thyroid levels are low which is an indicator or hyperthyroidism or overactive thyroid. I will need to refer you to endocrinology which is a specialist in thyroid disease. Also your kidney function is slightly lower than last year. Please make sure you are drinking at least 48 oz of water per day. Will continue to monitor. Not drinking enough water could contribute to dehydration and dizziness. Vitamin D is also slightly low. You can take 800-1000units of vitamin D daily OTC

## 2018-04-04 ENCOUNTER — Encounter: Payer: Self-pay | Admitting: Pharmacist

## 2018-04-05 ENCOUNTER — Encounter: Payer: Self-pay | Admitting: Pharmacist

## 2018-04-05 ENCOUNTER — Ambulatory Visit: Payer: Self-pay | Attending: Family Medicine | Admitting: Pharmacist

## 2018-04-05 VITALS — BP 166/79 | HR 74

## 2018-04-05 DIAGNOSIS — Z7901 Long term (current) use of anticoagulants: Secondary | ICD-10-CM | POA: Insufficient documentation

## 2018-04-05 DIAGNOSIS — I10 Essential (primary) hypertension: Secondary | ICD-10-CM | POA: Insufficient documentation

## 2018-04-05 MED ORDER — LISINOPRIL 10 MG PO TABS: 10.0000 mg | ORAL_TABLET | Freq: Every day | ORAL | 2 refills | Status: DC

## 2018-04-05 MED FILL — LISINOPRIL 10 MG TABS: 10 | 30 days supply | Qty: 30 | Fill #0

## 2018-04-05 MED FILL — DICLOFENAC SODIUM 1% GEL: 1 | 12 days supply | Qty: 100 | Fill #0 | Status: TO

## 2018-04-05 NOTE — Progress Notes (Signed)
   S:    PCP: Fleming  Patient arrives in good spirits. Presents to the clinic for hypertension evaluation, counseling, and management. Patient was referred and last seen by Zelda on 03/21/18. BP that visit was 186/80. Lisinopril 5 mg daily initiated.   Patient denies CP, SOB, HA, or dizziness. Reports feeling better at home since addition of lisinopril. Of note, he does report lower leg pain but does state that his swelling is improved. No issues with lisinopril.   Patient reports adherence with medications.  Current BP Medications include:   - lisinopril 5 mg daily  Antihypertensives tried in the past include:  - amlodipine 5 mg (stopped in the past d/t orthostatic hypotension) -  Lisinopril 10 mg   Dietary habits include:  - limits salt - reports drinking 2 cups of tea maximum per day Exercise habits include: - mobility limited by chronic bilateral knee and lower back pain Family / Social history:  - Tobacco: never smoker - Alcohol: denies use  Home BP readings:  - SBPs 120s-130s  - DBPs in 80s - reported by daughter-in-law; reports that patient has white-coat hypertension  O:  L arm after 5 minutes rest: 166/79; HR 74 Last 3 Office BP readings: BP Readings from Last 3 Encounters:  03/21/18 (!) 186/80  01/24/17 (!) 152/90  12/09/16 (!) 167/80    BMET    Component Value Date/Time   NA 142 03/21/2018 1548   K 4.7 03/21/2018 1548   CL 105 03/21/2018 1548   CO2 22 03/21/2018 1548   GLUCOSE 100 (H) 03/21/2018 1548   GLUCOSE 101 (H) 01/24/2017 2012   BUN 30 (H) 03/21/2018 1548   CREATININE 1.43 (H) 03/21/2018 1548   CREATININE 1.52 (H) 08/25/2016 1420   CALCIUM 9.2 03/21/2018 1548   GFRNONAA 45 (L) 03/21/2018 1548   GFRAA 52 (L) 03/21/2018 1548    Renal function: CrCl cannot be calculated (Unknown ideal weight.).  A/P: Hypertension longstanding currently uncontrolled on lisinopril. BP Goal <130/80 mmHg. Patient is adherent with current medications. His home  pressures vary from above to at goal. He has been elevated in clinic for 2 subsequent visits. Will increase lisinopril for now. Patient's daughter-in-law knows to call me with hypotension or side effects.  -Increased dose of lisinopril from 5 mg to 10 mg daily.  -Counseled on lifestyle modifications for blood pressure control including reduced dietary sodium, increased exercise, adequate sleep  Results reviewed and written information provided. Total time in face-to-face counseling 15 minutes.   F/U with me in 2 weeks.    Benard Halsted, PharmD, [place] [phone]

## 2018-04-05 NOTE — Patient Instructions (Signed)
Thank you for coming to see James Ayers today.   Blood pressure today is elevated. Your goal is under 130/80.  Start taking lisinopril 10 mg daily.   Limiting salt and caffeine, as well as exercising as able for at least 30 minutes for 5 days out of the week, can also help you lower your blood pressure.  Take your blood pressure at home if you are able. Please write down these numbers and bring them to your visits.  If you have any questions about medications, please call me [phone].  Lurena Joiner

## 2018-05-24 ENCOUNTER — Ambulatory Visit: Payer: Self-pay | Attending: Nurse Practitioner | Admitting: Nurse Practitioner

## 2018-05-24 ENCOUNTER — Encounter: Payer: Self-pay | Admitting: Nurse Practitioner

## 2018-05-24 VITALS — BP 185/93 | HR 75 | Temp 98.1°F | Ht 74.0 in | Wt 203.6 lb

## 2018-05-24 DIAGNOSIS — N401 Enlarged prostate with lower urinary tract symptoms: Secondary | ICD-10-CM | POA: Insufficient documentation

## 2018-05-24 DIAGNOSIS — M25561 Pain in right knee: Secondary | ICD-10-CM | POA: Insufficient documentation

## 2018-05-24 DIAGNOSIS — Z79899 Other long term (current) drug therapy: Secondary | ICD-10-CM | POA: Insufficient documentation

## 2018-05-24 DIAGNOSIS — E559 Vitamin D deficiency, unspecified: Secondary | ICD-10-CM | POA: Insufficient documentation

## 2018-05-24 DIAGNOSIS — Z880 Allergy status to penicillin: Secondary | ICD-10-CM | POA: Insufficient documentation

## 2018-05-24 DIAGNOSIS — M170 Bilateral primary osteoarthritis of knee: Secondary | ICD-10-CM | POA: Insufficient documentation

## 2018-05-24 DIAGNOSIS — I10 Essential (primary) hypertension: Secondary | ICD-10-CM | POA: Insufficient documentation

## 2018-05-24 DIAGNOSIS — Z7982 Long term (current) use of aspirin: Secondary | ICD-10-CM | POA: Insufficient documentation

## 2018-05-24 DIAGNOSIS — M25562 Pain in left knee: Secondary | ICD-10-CM | POA: Insufficient documentation

## 2018-05-24 DIAGNOSIS — K219 Gastro-esophageal reflux disease without esophagitis: Secondary | ICD-10-CM | POA: Insufficient documentation

## 2018-05-24 DIAGNOSIS — M159 Polyosteoarthritis, unspecified: Secondary | ICD-10-CM

## 2018-05-24 DIAGNOSIS — N400 Enlarged prostate without lower urinary tract symptoms: Secondary | ICD-10-CM | POA: Insufficient documentation

## 2018-05-24 DIAGNOSIS — N138 Other obstructive and reflux uropathy: Secondary | ICD-10-CM | POA: Insufficient documentation

## 2018-05-24 MED ORDER — DICLOFENAC SODIUM 1 % TD GEL: 4.0000 g | Freq: Four times a day (QID) | TRANSDERMAL | 3 refills | Status: DC

## 2018-05-24 MED ORDER — CLONIDINE HCL 0.1 MG PO TABS
0.2000 mg | ORAL_TABLET | Freq: Once | ORAL | Status: AC
  Administered 2018-05-24: 0.2 mg via ORAL

## 2018-05-24 MED ORDER — FINASTERIDE 5 MG PO TABS: 5.0000 mg | ORAL_TABLET | Freq: Every day | ORAL | 2 refills | Status: DC

## 2018-05-24 MED ORDER — DOXAZOSIN MESYLATE 4 MG PO TABS: 4.0000 mg | ORAL_TABLET | Freq: Every day | ORAL | 2 refills | Status: DC

## 2018-05-24 MED ORDER — LISINOPRIL 10 MG PO TABS: 10.0000 mg | ORAL_TABLET | Freq: Every day | ORAL | 2 refills | Status: DC

## 2018-05-24 MED FILL — LISINOPRIL 10 MG TABS: 10 | 90 days supply | Qty: 90 | Fill #0

## 2018-05-24 MED FILL — DICLOFENAC SODIUM 1% GEL: 1 | 18 days supply | Qty: 300 | Fill #0

## 2018-05-24 NOTE — Progress Notes (Signed)
Assessment & Plan:  James Ayers was seen today for knee pain.  Diagnoses and all orders for this visit:  Essential hypertension -     cloNIDine (CATAPRES) tablet 0.2 mg -     lisinopril (PRINIVIL,ZESTRIL) 20 MG tablet; Take 1 tablet (10 mg total) by mouth daily. Continue all antihypertensives as prescribed.  Remember to bring in your blood pressure log with you for your follow up appointment.  DASH/Mediterranean Diets are healthier choices for HTN.   Generalized OA -     diclofenac sodium (VOLTAREN) 1 % GEL; Apply 4 g topically 4 (four) times daily. Work on losing weight to help reduce joint pain. May alternate with heat and ice application for pain relief. May also alternate with acetaminophen and Ibuprofen as prescribed pain relief. Other alternatives include massage, acupuncture and water aerobics.  You must stay active and avoid a sedentary lifestyle.   BPH with obstruction/lower urinary tract symptoms -     finasteride (PROSCAR) 5 MG tablet; Take 1 tablet (5 mg total) by mouth at bedtime. -     doxazosin (CARDURA) 4 MG tablet; Take 1 tablet (4 mg total) by mouth at bedtime.    Patient has been counseled on age-appropriate routine health concerns for screening and prevention. These are reviewed and up-to-date. Referrals have been placed accordingly. Immunizations are up-to-date or declined.    Subjective:   Chief Complaint  Patient presents with  . Knee Pain    Pt. stated the knee pain has been going on for a long time. Patient applied for Medicaid and still waiting for the approval.    HPI James Ayers 84 y.o. male presents to office today for follow up of chronic bilateral knee pain.  He is accompanied by his daughter who is interpreting for him today.   Essential Hypertension Blood pressure is elevated today. He ran out of his blood pressure medication and did not request refills. He required clonidine today here in the office. Current medication regimen includes  lisinopril 10mg  daily. Will increase to 20 mg today. Will need to check renal function in 3 weeks.  He currently denies chest pain, shortness of breath, palpitations, lightheadedness, dizziness, headaches or BLE edema. His daughter endorses normal blood pressure readings at home.   BP Readings from Last 3 Encounters:  05/24/18 (!) 185/93  04/05/18 (!) 166/79  03/21/18 (!) 186/80   Knee pain Chronic. He has generalized OA in bilateral knees and low back. He does not exercise. Wears bilateral sleeves on his knees for support and pain relief. Aggravating factors: standing or sitting for prolonged periods of time, bending, twisting, squatting. He is also using a voltaren gel, acetaminophen and naproxen (sparingly).  Right and Left Knee Xrays 12-09-2016 Moderate to advanced tricompartment degenerative changes. No acute findings.   BPH Taking cardua 4 mg and proscar 5mg  for symptoms. He has seen a Dealer is [place] but needs to follow up with Urology here. Needs referral. Patient has been advised to apply for financial assistance and schedule to see our financial counselor.   Review of Systems  Constitutional: Negative for fever, malaise/fatigue and weight loss.  HENT: Negative.  Negative for nosebleeds.   Eyes: Positive for blurred vision. Negative for double vision and photophobia.  Respiratory: Negative.  Negative for cough and shortness of breath.   Cardiovascular: Negative.  Negative for chest pain, palpitations and leg swelling.  Gastrointestinal: Positive for heartburn. Negative for nausea and vomiting.  Genitourinary: Negative for dysuria, flank pain, frequency, hematuria and urgency.  Urinary hesitancy  Musculoskeletal: Negative.  Negative for myalgias.  Neurological: Negative.  Negative for dizziness, focal weakness, seizures and headaches.  Psychiatric/Behavioral: Negative.  Negative for suicidal ideas.    Past Medical History:  Diagnosis Date  . BPH (benign prostatic  hyperplasia)   . GERD (gastroesophageal reflux disease)   . Glaucoma   . Hypertension   . Osteoarthritis    both knee  . Vitamin D deficiency     Past Surgical History:  Procedure Laterality Date  . CATARACT EXTRACTION    . COLONOSCOPY WITH PROPOFOL N/A 11/05/2016   Procedure: COLONOSCOPY WITH PROPOFOL;  Surgeon: Wilford Corner, MD;  Location: [facility];  Service: Endoscopy;  Laterality: N/A;  . ESOPHAGOGASTRODUODENOSCOPY (EGD) WITH PROPOFOL N/A 11/05/2016   Procedure: ESOPHAGOGASTRODUODENOSCOPY (EGD) WITH PROPOFOL;  Surgeon: Wilford Corner, MD;  Location: [facility];  Service: Endoscopy;  Laterality: N/A;  . EYE SURGERY Bilateral 2007    Family History  Problem Relation Age of Onset  . Diabetes Neg Hx     Social History Reviewed with no changes to be made today.   Outpatient Medications Prior to Visit  Medication Sig Dispense Refill  . acetaminophen (TYLENOL) 325 MG tablet Take 650 mg by mouth every 6 (six) hours as needed for moderate pain or headache.    Marland Kitchen aspirin EC 81 MG tablet Take 81 mg by mouth daily.    . brimonidine (ALPHAGAN) 0.2 % ophthalmic solution Place 1 drop into both eyes 2 (two) times daily.     . dorzolamide (TRUSOPT) 2 % ophthalmic solution Place 1 drop into both eyes 2 (two) times daily.    . prednisoLONE acetate (PRED FORTE) 1 % ophthalmic suspension Place 1 drop into the left eye 2 (two) times daily.     . Travoprost, BAK Free, (TRAVATAN) 0.004 % SOLN ophthalmic solution Place 1 drop into both eyes at bedtime.     . diclofenac sodium (VOLTAREN) 1 % GEL Apply 2 g topically 4 (four) times daily. 100 g 2  . doxazosin (CARDURA) 4 MG tablet Take 1 tablet (4 mg total) by mouth at bedtime. 30 tablet 2  . finasteride (PROSCAR) 5 MG tablet Take 1 tablet (5 mg total) by mouth at bedtime. 30 tablet 2  . lisinopril (PRINIVIL,ZESTRIL) 10 MG tablet Take 1 tablet (10 mg total) by mouth daily. 30 tablet 2  . pantoprazole (PROTONIX) 40 MG tablet Take 1 tablet (40  mg total) by mouth 2 (two) times daily. (Patient not taking: Reported on 03/21/2018) 30 tablet 0   No facility-administered medications prior to visit.     Allergies  Allergen Reactions  . Penicillins Rash    Has patient had a PCN reaction causing immediate rash, facial/tongue/throat swelling, SOB or lightheadedness with hypotension: Yes Has patient had a PCN reaction causing severe rash involving mucus membranes or skin necrosis: No Has patient had a PCN reaction that required hospitalization No Has patient had a PCN reaction occurring within the last 10 years: No If all of the above answers are "NO", then may proceed with Cephalosporin use.        Objective:    BP (!) 185/93 (BP Location: Left Arm, Patient Position: Sitting, Cuff Size: Normal)   Pulse 75   Temp 98.1 F (36.7 C) (Oral)   Ht 6\' 2"  (1.88 m)   Wt 203 lb 9.6 oz (92.4 kg)   SpO2 98%   BMI 26.14 kg/m  Wt Readings from Last 3 Encounters:  05/24/18 203 lb 9.6 oz (92.4 kg)  03/21/18 197 lb (89.4 kg)  01/24/17 201 lb (91.2 kg)    Physical Exam  Constitutional: He is oriented to person, place, and time. He appears well-developed and well-nourished. He is cooperative.  HENT:  Head: Normocephalic and atraumatic.  Eyes: EOM are normal.  Neck: Normal range of motion.  Cardiovascular: Normal rate, regular rhythm, normal heart sounds and intact distal pulses. Exam reveals no gallop and no friction rub.  No murmur heard. Pulmonary/Chest: Effort normal and breath sounds normal. No tachypnea. No respiratory distress. He has no decreased breath sounds. He has no wheezes. He has no rhonchi. He has no rales. He exhibits no tenderness.  Abdominal: Soft. Bowel sounds are normal.  Musculoskeletal: He exhibits edema and tenderness. He exhibits no deformity.       Right knee: He exhibits decreased range of motion and swelling. Tenderness found. Medial joint line and lateral joint line tenderness noted.       Left knee: He exhibits  decreased range of motion and swelling. Tenderness found. Medial joint line and lateral joint line tenderness noted.  Neurological: He is alert and oriented to person, place, and time. Coordination normal.  Skin: Skin is warm and dry.  Psychiatric: He has a normal mood and affect. His behavior is normal. Judgment and thought content normal.  Nursing note and vitals reviewed.        Patient has been counseled extensively about nutrition and exercise as well as the importance of adherence with medications and regular follow-up. The patient was given clear instructions to go to ER or return to medical center if symptoms don't improve, worsen or new problems develop. The patient verbalized understanding.   Follow-up: Return in about 4 months (around 09/24/2018) for HTN/BPH/GERD.   Gildardo Pounds, FNP-BC [facility] and [place], [place]   05/25/2018, 11:26 PM

## 2018-05-25 ENCOUNTER — Encounter: Payer: Self-pay | Admitting: Nurse Practitioner

## 2018-05-25 MED ORDER — AMLODIPINE BESYLATE 5 MG PO TABS: 5.0000 mg | ORAL_TABLET | Freq: Every day | ORAL | 3 refills | Status: DC

## 2018-05-26 ENCOUNTER — Telehealth: Payer: Self-pay

## 2018-05-26 NOTE — Telephone Encounter (Signed)
-----   Message from Gildardo Pounds, NP sent at 05/25/2018 11:34 PM EDT ----- Please let patient know to increase lisinopril to 20mg  daily. He can take 2 tablets for now. 1 in the am and 1 in the evening. Have him make a f/u appointment in 3 weeks for bp recheck with [facility].

## 2018-05-26 NOTE — Telephone Encounter (Signed)
CMA spoke to patient's daughter in law and she said she checked him Blood pressure at home and it range 128/87.  She would like to know if the patient still need to double the dose for Lisinopril.  Appt for look for Blood pressure recheck has been scheduled for 06/20/2018.

## 2018-05-29 NOTE — Telephone Encounter (Signed)
She can continue just the 10 mg of lisinopril for now as long as his blood pressure is consistently less than 130/80. Needs to bring in the home blood pressure monitor for the upcoming office visit.

## 2018-05-31 NOTE — Telephone Encounter (Signed)
CMA spoke to patient's daughter in law and inform PCP advising.  Patient's daughter in law understood and will bring BP monitor in the next visit.

## 2018-06-20 ENCOUNTER — Ambulatory Visit: Payer: Self-pay | Admitting: Pharmacist

## 2019-03-31 MED FILL — LISINOPRIL 10 MG TABS: 10 | 90 days supply | Qty: 90 | Fill #1

## 2019-03-31 MED FILL — AMLODIPINE BESYLATE 5 MG TA: 5 | 30 days supply | Qty: 30 | Fill #0

## 2019-03-31 MED FILL — DICLOFENAC SODIUM 1% GEL: 1 | 6 days supply | Qty: 100 | Fill #1

## 2019-04-10 MED FILL — AMLODIPINE BESYLATE 5 MG TA: 5 | 30 days supply | Qty: 30 | Fill #0

## 2019-04-10 MED FILL — DICLOFENAC SODIUM 1% GEL: 1 | 6 days supply | Qty: 100 | Fill #1

## 2019-04-12 MED FILL — LISINOPRIL 10 MG TABS: 10 | 90 days supply | Qty: 90 | Fill #1

## 2019-06-01 ENCOUNTER — Encounter (INDEPENDENT_AMBULATORY_CARE_PROVIDER_SITE_OTHER): Payer: Self-pay

## 2019-06-13 ENCOUNTER — Other Ambulatory Visit: Payer: Self-pay | Admitting: Nurse Practitioner

## 2019-06-13 DIAGNOSIS — I10 Essential (primary) hypertension: Secondary | ICD-10-CM

## 2020-12-12 ENCOUNTER — Ambulatory Visit: Payer: Self-pay | Admitting: *Deleted

## 2020-12-12 ENCOUNTER — Other Ambulatory Visit: Payer: Self-pay

## 2020-12-12 ENCOUNTER — Emergency Department (HOSPITAL_COMMUNITY)
Admission: EM | Admit: 2020-12-12 | Discharge: 2020-12-13 | Disposition: A | Discharge status: Home / Self Care | Payer: Self-pay | Attending: Emergency Medicine | Admitting: Emergency Medicine

## 2020-12-12 DIAGNOSIS — I10 Essential (primary) hypertension: Secondary | ICD-10-CM | POA: Insufficient documentation

## 2020-12-12 DIAGNOSIS — K429 Umbilical hernia without obstruction or gangrene: Secondary | ICD-10-CM | POA: Insufficient documentation

## 2020-12-12 DIAGNOSIS — M25572 Pain in left ankle and joints of left foot: Secondary | ICD-10-CM

## 2020-12-12 DIAGNOSIS — M2550 Pain in unspecified joint: Secondary | ICD-10-CM | POA: Insufficient documentation

## 2020-12-12 DIAGNOSIS — N281 Cyst of kidney, acquired: Secondary | ICD-10-CM | POA: Insufficient documentation

## 2020-12-12 DIAGNOSIS — R197 Diarrhea, unspecified: Secondary | ICD-10-CM | POA: Insufficient documentation

## 2020-12-12 DIAGNOSIS — K219 Gastro-esophageal reflux disease without esophagitis: Secondary | ICD-10-CM | POA: Insufficient documentation

## 2020-12-12 DIAGNOSIS — N400 Enlarged prostate without lower urinary tract symptoms: Secondary | ICD-10-CM

## 2020-12-12 DIAGNOSIS — R1084 Generalized abdominal pain: Secondary | ICD-10-CM

## 2020-12-12 DIAGNOSIS — N401 Enlarged prostate with lower urinary tract symptoms: Secondary | ICD-10-CM | POA: Insufficient documentation

## 2020-12-12 LAB — COMPREHENSIVE METABOLIC PANEL
ALT: 21 U/L (ref 0–44)
AST: 28 U/L (ref 15–41)
Albumin: 3.3 g/dL — ABNORMAL LOW (ref 3.5–5.0)
Alkaline Phosphatase: 60 U/L (ref 38–126)
Anion gap: 7 (ref 5–15)
BUN: 17 mg/dL (ref 8–23)
CO2: 28 mmol/L (ref 22–32)
Calcium: 8.8 mg/dL — ABNORMAL LOW (ref 8.9–10.3)
Chloride: 105 mmol/L (ref 98–111)
Creatinine, Ser: 1.27 mg/dL — ABNORMAL HIGH (ref 0.61–1.24)
GFR, Estimated: 55 mL/min — ABNORMAL LOW (ref >60)
Glucose, Bld: 96 mg/dL (ref 70–99)
Potassium: 3.9 mmol/L (ref 3.5–5.1)
Sodium: 140 mmol/L (ref 135–145)
Total Bilirubin: 0.8 mg/dL (ref 0.3–1.2)
Total Protein: 7.2 g/dL (ref 6.5–8.1)

## 2020-12-12 LAB — LIPASE, BLOOD: Lipase: 28 U/L (ref 11–51)

## 2020-12-12 LAB — CBC
HCT: 45.1 % (ref 39.0–52.0)
Hemoglobin: 14.6 g/dL (ref 13.0–17.0)
MCH: 30.5 pg (ref 26.0–34.0)
MCHC: 32.4 g/dL (ref 30.0–36.0)
MCV: 94.4 fL (ref 80.0–100.0)
Platelets: 199 10*3/uL (ref 150–400)
RBC: 4.78 MIL/uL (ref 4.22–5.81)
RDW: 13.8 % (ref 11.5–15.5)
WBC: 7 10*3/uL (ref 4.0–10.5)
nRBC: 0 % (ref 0.0–0.2)

## 2020-12-12 LAB — URINALYSIS, ROUTINE W REFLEX MICROSCOPIC
Bilirubin Urine: NEGATIVE
Glucose, UA: NEGATIVE mg/dL
Hgb urine dipstick: NEGATIVE
Ketones, ur: NEGATIVE mg/dL
Leukocytes,Ua: NEGATIVE
Nitrite: NEGATIVE
Protein, ur: NEGATIVE mg/dL
Specific Gravity, Urine: 1.019 (ref 1.005–1.030)
pH: 6 (ref 5.0–8.0)

## 2020-12-12 NOTE — Telephone Encounter (Signed)
Patient's daughter in law called due to language barrier regarding worsening symptoms of abdominal pain , and incontinent bowels and decreased mobility x 10 days patient is experiencing.c/o low abdominal pain across lower abdomen radiating to back. C/o pain after eating , increasing poor appetite, feeling full after eating any food. Reports patient is drinking plenty of fluids. Worsening mobility and incontinent of bowels. Patient requires someone to carry him to the bathroom. Loose, incontinent  bm reported and now taking immodium without relief. Denies fever, chest pain , difficulty breathing, no vomiting or diarrhea or blood noted in stool. Denies yellowing of eyes and able to manage bladder.Patient last seen greater than 2 years ago and required new patient appt. Earliest appt for new patient 03/24/21. Encouraged daughter in law if worsening  Pain then take patient to UC or ED for assessment. Care advise given. Patient's daughter in law verbalized understanding of care advise and to call back or go to [facility] or ED if symptoms worsens.  Reason for Disposition . Age > 60 years  Answer Assessment - Initial Assessment Questions 1. LOCATION: "Where does it hurt?"      Lower abdominal pain 2. RADIATION: "Does the pain shoot anywhere else?" (e.g., chest, back)     To the back  3. ONSET: "When did the pain begin?" (Minutes, hours or days ago)      10 days ago  4. SUDDEN: "Gradual or sudden onset?"     Sudden  5. PATTERN "Does the pain come and go, or is it constant?"    - If constant: "Is it getting better, staying the same, or worsening?"      (Note: Constant means the pain never goes away completely; most serious pain is constant and it progresses)     - If intermittent: "How long does it last?" "Do you have pain now?"     (Note: Intermittent means the pain goes away completely between bouts)     Comes and goes  6. SEVERITY: "How bad is the pain?"  (e.g., Scale 1-10; mild, moderate, or severe)    -  MILD (1-3): doesn't interfere with normal activities, abdomen soft and not tender to touch     - MODERATE (4-7): interferes with normal activities or awakens from sleep, tender to touch     - SEVERE (8-10): excruciating pain, doubled over, unable to do any normal activities       Moderate  7. RECURRENT SYMPTOM: "Have you ever had this type of stomach pain before?" If Yes, ask: "When was the last time?" and "What happened that time?"      No  8. CAUSE: "What do you think is causing the stomach pain?"     Unsure  9. RELIEVING/AGGRAVATING FACTORS: "What makes it better or worse?" (e.g., movement, antacids, bowel movement)     Bowel movement at times  10. OTHER SYMPTOMS: "Has there been any vomiting, diarrhea, constipation, or urine problems?"        Loose stool  Protocols used: ABDOMINAL PAIN - MALE-A-AH

## 2020-12-12 NOTE — ED Provider Notes (Signed)
Emergency Medicine Provider Triage Evaluation Note  James Ayers , a 87 y.o. male  was evaluated in triage.  Pt complains of abdominal pain and leg pain.  Review of Systems  Positive: Nausea and vomiting  Negative: No fever  Physical Exam  BP (!) 182/94 (BP Location: Right Arm)   Pulse 65   Temp 98.1 F (36.7 C) (Oral)   Resp 16   SpO2 100%  Gen:   Awake, no distress   Resp:  Normal effort  MSK:   Moves extremities without difficulty  Other:    Medical Decision Making  Medically screening exam initiated at 5:01 PM.  Appropriate orders placed.  James Ayers was informed that the remainder of the evaluation will be completed by another provider, this initial triage assessment does not replace that evaluation, and the importance of remaining in the ED until their evaluation is complete.     Fransico Meadow, PA-C 12/12/20 [place], MD 12/24/20 [phone]

## 2020-12-12 NOTE — ED Triage Notes (Signed)
Using arabic interpreter pt states he has abdominal pain and diarrhea x 1 year. Also reports inability to walk d/t bilateral knee and foot pain for several months without injury.

## 2020-12-13 ENCOUNTER — Other Ambulatory Visit: Payer: Self-pay

## 2020-12-13 ENCOUNTER — Emergency Department (HOSPITAL_COMMUNITY): Payer: Self-pay

## 2020-12-13 ENCOUNTER — Other Ambulatory Visit: Payer: Self-pay | Admitting: Emergency Medicine

## 2020-12-13 MED ORDER — IOHEXOL 300 MG/ML  SOLN
100.0000 mL | Freq: Once | INTRAMUSCULAR | Status: AC | PRN
  Administered 2020-12-13: 100 mL via INTRAVENOUS

## 2020-12-13 MED ORDER — LOPERAMIDE HCL 2 MG PO CAPS
2.0000 mg | ORAL_CAPSULE | Freq: Four times a day (QID) | ORAL | 0 refills | Status: AC | PRN
  Filled 2020-12-13: qty 12, 3d supply, fill #0

## 2020-12-13 MED ORDER — DICYCLOMINE HCL 20 MG PO TABS
20.0000 mg | ORAL_TABLET | Freq: Three times a day (TID) | ORAL | 0 refills | Status: DC | PRN
  Filled 2020-12-13: qty 20, 7d supply, fill #0

## 2020-12-13 MED ORDER — DICLOFENAC SODIUM 1 % EX GEL
2.0000 g | Freq: Four times a day (QID) | CUTANEOUS | 0 refills | Status: DC | PRN
  Filled 2020-12-13: qty 50, fill #0

## 2020-12-13 NOTE — Discharge Instructions (Signed)

## 2020-12-13 NOTE — ED Provider Notes (Signed)
Emergency Department Provider Note   I have reviewed the triage vital signs and the nursing notes.   HISTORY  Chief Complaint Abdominal Pain and Diarrhea   HPI James Ayers is a 87 y.o. male with past medical history reviewed below presents to the emergency department with abdominal pain and joint discomfort since his recent travel here to the [place].  He is coming from the [place] here to stay with family.  He has had joint pains and some diarrhea in the past but symptoms been more severe especially over the past couple of months.  He has had some fecal incontinence.  Family denies any fevers or chills.  No vomiting.  He is having pain in both ankles and knees which is limiting his ability to get around.  Family denies any known history of inflammatory colitis, recent antibiotics, or other associated factors.   Patient had difficulty hearing the Arabic interpreter line.  Family assisting with translation in the setting.    Past Medical History:  Diagnosis Date  . BPH (benign prostatic hyperplasia)   . GERD (gastroesophageal reflux disease)   . Glaucoma   . Hearing loss   . Hypertension   . Osteoarthritis    both knee  . Vitamin D deficiency     Patient Active Problem List   Diagnosis Date Noted  . Language barrier 12/09/2016  . Bacterial infection due to H. pylori 12/09/2016  . Abdominal tenderness, left lower quadrant 11/05/2016  . Gastroesophageal reflux disease 11/05/2016  . Hemorrhage of rectum and anus 11/05/2016  . Essential hypertension 08/25/2016  . Benign prostatic hyperplasia with lower urinary tract symptoms 08/25/2016  . Chronic midline low back pain 08/25/2016    Past Surgical History:  Procedure Laterality Date  . CATARACT EXTRACTION    . COLONOSCOPY WITH PROPOFOL N/A 11/05/2016   Procedure: COLONOSCOPY WITH PROPOFOL;  Surgeon: Wilford Corner, MD;  Location: [facility];  Service: Endoscopy;  Laterality: N/A;  .  ESOPHAGOGASTRODUODENOSCOPY (EGD) WITH PROPOFOL N/A 11/05/2016   Procedure: ESOPHAGOGASTRODUODENOSCOPY (EGD) WITH PROPOFOL;  Surgeon: Wilford Corner, MD;  Location: [facility];  Service: Endoscopy;  Laterality: N/A;  . EYE SURGERY Bilateral 2007    Allergies Penicillins  Family History  Problem Relation Age of Onset  . Diabetes Neg Hx     Social History Social History   Tobacco Use  . Smoking status: Never Smoker  . Smokeless tobacco: Never Used  Vaping Use  . Vaping Use: Never used  Substance Use Topics  . Alcohol use: No  . Drug use: No    Review of Systems  Constitutional: No fever/chills Eyes: No visual changes. ENT: No sore throat. Cardiovascular: Denies chest pain. Respiratory: Denies shortness of breath. Gastrointestinal: Positive lower abdominal pain.  No nausea, no vomiting. Positive diarrhea.  No constipation. Genitourinary: Negative for dysuria. Musculoskeletal: Negative for back pain. Positive bilateral ankle/knee pain.  Skin: Negative for rash. Neurological: Negative for headaches, focal weakness or numbness.  10-point ROS otherwise negative.  ____________________________________________   PHYSICAL EXAM:  VITAL SIGNS: ED Triage Vitals  Enc Vitals Group     BP 12/12/20 1647 (!) 182/94     Pulse Rate 12/12/20 1647 65     Resp 12/12/20 1647 16     Temp 12/12/20 1647 98.1 F (36.7 C)     Temp Source 12/12/20 1647 Oral     SpO2 12/12/20 1647 100 %   Constitutional: Alert and oriented. Well appearing and in no acute distress. Eyes: Conjunctivae are normal.  Head: Atraumatic. Nose: No congestion/rhinnorhea. Mouth/Throat: Mucous membranes are moist.   Neck: No stridor.   Cardiovascular: Normal rate, regular rhythm. Good peripheral circulation. Grossly normal heart sounds.   Respiratory: Normal respiratory effort.  No retractions. Lungs CTAB. Gastrointestinal: Soft with mild lower abdominal tenderness. No distention.  Musculoskeletal: No lower  extremity tenderness nor edema. No gross deformities of extremities. Neurologic:  Normal speech and language.  Skin:  Skin is warm, dry and intact. No rash noted.   ____________________________________________   LABS (all labs ordered are listed, but only abnormal results are displayed)  Labs Reviewed  COMPREHENSIVE METABOLIC PANEL - Abnormal; Notable for the following components:      Result Value   Creatinine, Ser 1.27 (*)    Calcium 8.8 (*)    Albumin 3.3 (*)    GFR, Estimated 55 (*)    All other components within normal limits  C DIFFICILE QUICK SCREEN W PCR REFLEX  LIPASE, BLOOD  CBC  URINALYSIS, ROUTINE W REFLEX MICROSCOPIC   ____________________________________________  EKG  None  ____________________________________________  RADIOLOGY  CT ABDOMEN PELVIS W CONTRAST  Result Date: 12/13/2020 CLINICAL DATA:  Abdominal pain and diarrhea. EXAM: CT ABDOMEN AND PELVIS WITH CONTRAST TECHNIQUE: Multidetector CT imaging of the abdomen and pelvis was performed using the standard protocol following bolus administration of intravenous contrast. CONTRAST:  136mL OMNIPAQUE IOHEXOL 300 MG/ML  SOLN COMPARISON:  January 24, 2017 FINDINGS: Lower chest: Mild atelectasis and/or early infiltrate is seen within the posterior aspect of the right lung base. Hepatobiliary: No focal liver abnormality is seen. No gallstones, gallbladder wall thickening, or biliary dilatation. Pancreas: Unremarkable. No pancreatic ductal dilatation or surrounding inflammatory changes. Spleen: Normal in size without focal abnormality. Adrenals/Urinary Tract: Adrenal glands are unremarkable. The kidneys are slightly atrophic and lobulated in appearance. Multiple bilateral simple renal cysts are seen. The largest measures approximately 3.0 cm in diameter and is located within the upper pole of the right kidney. Bladder is unremarkable. Stomach/Bowel: Stomach is within normal limits. Appendix appears normal. No evidence of  bowel wall thickening, distention, or inflammatory changes. Vascular/Lymphatic: Aortic atherosclerosis. No enlarged abdominal or pelvic lymph nodes. Reproductive: Moderate to marked severity prostate gland enlargement is seen. Other: A 4.2 cm x 2.8 cm fat containing umbilical hernia is noted. No abdominopelvic ascites. Musculoskeletal: Degenerative changes are seen throughout the lumbar spine. IMPRESSION: 1. Multiple bilateral simple renal cysts. 2. Fat containing umbilical hernia. 3. Enlarged prostate gland. Electronically Signed   By: Virgina Norfolk M.D.   On: 12/13/2020 03:50    ____________________________________________   PROCEDURES  Procedure(s) performed:   Procedures  Non e ____________________________________________   INITIAL IMPRESSION / ASSESSMENT AND PLAN / ED COURSE  Pertinent labs & imaging results that were available during my care of the patient were reviewed by me and considered in my medical decision making (see chart for details).   Patient presents to the emergency department for evaluation of lower abdominal pain with diarrhea worsening in the past 2 months with associated joint pain.  No prior history of inflammatory colitis which would be a unifying diagnosis..  Question travel related enteritis, C. difficile although lower risk, diverticulitis.  Plan for CT imaging.  Lab work from the [employer] process is largely unremarkable.  Patient's joint pain seems bilateral and likely arthritis related.  No joints are inflamed, red, hot to suspect infection.  CT imaging reviewed showing a fat-containing umbilical hernia, renal cysts, enlarged prostate.  Patient's pain is not consistent with being caused by any of  these issues.  I have listed them in the AVS.  Plan for supportive care medications and PCP follow-up.  Patient is feeling well here and tolerating PO. Results and plan discussed with patient and son at bedside.  ____________________________________________  FINAL  CLINICAL IMPRESSION(S) / ED DIAGNOSES  Final diagnoses:  Generalized abdominal pain  Diarrhea, unspecified type  Arthralgia of both ankles  Umbilical hernia without obstruction and without gangrene  Bilateral renal cysts  Enlarged prostate     MEDICATIONS GIVEN DURING THIS VISIT:  Medications  iohexol (OMNIPAQUE) 300 MG/ML solution 100 mL (100 mLs Intravenous Contrast Given 12/13/20 0228)     NEW OUTPATIENT MEDICATIONS STARTED DURING THIS VISIT:  Discharge Medication List as of 12/13/2020  5:09 AM    START taking these medications   Details  diclofenac Sodium (VOLTAREN) 1 % GEL Apply 2 g topically 4 (four) times daily as needed., Starting Fri 12/13/2020, Normal    dicyclomine (BENTYL) 20 MG tablet Take 1 tablet (20 mg total) by mouth 3 (three) times daily as needed for spasms., Starting Fri 12/13/2020, Normal    loperamide (IMODIUM) 2 MG capsule Take 1 capsule (2 mg total) by mouth 4 (four) times daily as needed for diarrhea or loose stools., Starting Fri 12/13/2020, Normal        Note:  This document was prepared using Dragon voice recognition software and may include unintentional dictation errors.  Nanda Quinton, MD, [facility] Emergency Medicine    Madisson Kulaga, [facility], MD 12/13/20 [phone]

## 2020-12-18 ENCOUNTER — Other Ambulatory Visit: Payer: Self-pay

## 2021-01-07 ENCOUNTER — Ambulatory Visit (INDEPENDENT_AMBULATORY_CARE_PROVIDER_SITE_OTHER): Payer: Self-pay | Admitting: Podiatry

## 2021-01-07 DIAGNOSIS — Z5329 Procedure and treatment not carried out because of patient's decision for other reasons: Secondary | ICD-10-CM

## 2021-01-07 NOTE — Progress Notes (Signed)
No show for appt. 

## 2021-01-09 ENCOUNTER — Other Ambulatory Visit: Payer: Self-pay

## 2021-01-09 MED ORDER — SUCRALFATE 1 G PO TABS
ORAL_TABLET | ORAL | 2 refills | Status: DC
  Filled 2021-01-09: qty 120, 30d supply, fill #0

## 2021-01-09 MED ORDER — OMEPRAZOLE 40 MG PO CPDR
40.0000 mg | DELAYED_RELEASE_CAPSULE | Freq: Every day | ORAL | 2 refills | Status: DC
  Filled 2021-01-09: qty 30, 30d supply, fill #0

## 2021-01-10 ENCOUNTER — Other Ambulatory Visit: Payer: Self-pay

## 2021-01-10 MED ORDER — OMEPRAZOLE 40 MG PO CPDR
DELAYED_RELEASE_CAPSULE | ORAL | 2 refills | Status: DC
  Filled 2021-01-10: qty 30, 30d supply, fill #0

## 2021-01-17 ENCOUNTER — Other Ambulatory Visit: Payer: Self-pay

## 2021-02-23 NOTE — Progress Notes (Deleted)
Established Patient Office Visit  Subjective:  Patient ID: James Ayers, male    DOB: [birth year]  Age: 87 y.o. MRN: [id number]  CC: No chief complaint on file.   HPI Kamal Kinnaird presents for re est PCP  PCP pt of Raul Del but not seen since 2019   Past Medical History:  Diagnosis Date   BPH (benign prostatic hyperplasia)    GERD (gastroesophageal reflux disease)    Glaucoma    Hearing loss    Hypertension    Osteoarthritis    both knee   Vitamin D deficiency     Past Surgical History:  Procedure Laterality Date   CATARACT EXTRACTION     COLONOSCOPY WITH PROPOFOL N/A 11/05/2016   Procedure: COLONOSCOPY WITH PROPOFOL;  Surgeon: Wilford Corner, MD;  Location: [facility];  Service: Endoscopy;  Laterality: N/A;   ESOPHAGOGASTRODUODENOSCOPY (EGD) WITH PROPOFOL N/A 11/05/2016   Procedure: ESOPHAGOGASTRODUODENOSCOPY (EGD) WITH PROPOFOL;  Surgeon: Wilford Corner, MD;  Location: [facility];  Service: Endoscopy;  Laterality: N/A;   EYE SURGERY Bilateral 2007    Family History  Problem Relation Age of Onset   Diabetes Neg Hx     Social History   Socioeconomic History   Marital status: Married    Spouse name: Not on file   Number of children: Not on file   Years of education: Not on file   Highest education level: Not on file  Occupational History   Not on file  Tobacco Use   Smoking status: Never   Smokeless tobacco: Never  Vaping Use   Vaping Use: Never used  Substance and Sexual Activity   Alcohol use: No   Drug use: No   Sexual activity: Never  Other Topics Concern   Not on file  Social History Narrative   Not on file   Social Determinants of Health   Financial Resource Strain: Not on file  Food Insecurity: Not on file  Transportation Needs: Not on file  Physical Activity: Not on file  Stress: Not on file  Social Connections: Not on file  Intimate Partner Violence: Not on file    Outpatient Medications Prior to Visit  Medication Sig  Dispense Refill   acetaminophen (TYLENOL) 325 MG tablet Take 650 mg by mouth every 6 (six) hours as needed for moderate pain or headache.     amLODipine (NORVASC) 5 MG tablet Take 1 tablet (5 mg total) by mouth daily. 90 tablet 3   aspirin EC 81 MG tablet Take 81 mg by mouth daily.     brimonidine (ALPHAGAN) 0.2 % ophthalmic solution Place 1 drop into both eyes 2 (two) times daily.      diclofenac Sodium (VOLTAREN) 1 % GEL Apply 2 g topically 4 (four) times daily as needed. 50 g 0   dicyclomine (BENTYL) 20 MG tablet Take 1 tablet (20 mg total) by mouth 3 (three) times daily as needed for spasms. 20 tablet 0   dorzolamide (TRUSOPT) 2 % ophthalmic solution Place 1 drop into both eyes 2 (two) times daily.     doxazosin (CARDURA) 4 MG tablet Take 1 tablet (4 mg total) by mouth at bedtime. 90 tablet 2   finasteride (PROSCAR) 5 MG tablet Take 1 tablet (5 mg total) by mouth at bedtime. 90 tablet 2   lisinopril (PRINIVIL,ZESTRIL) 10 MG tablet Take 1 tablet (10 mg total) by mouth daily. 90 tablet 2   loperamide (IMODIUM) 2 MG capsule Take 1 capsule (2 mg total) by mouth 4 (four) times daily  as needed for diarrhea or loose stools. 12 capsule 0   omeprazole (PRILOSEC) 40 MG capsule take 1 cap by mouth once daily 30 capsule 2   omeprazole (PRILOSEC) 40 MG capsule take one capsule by mouth daily 30 capsule 2   prednisoLONE acetate (PRED FORTE) 1 % ophthalmic suspension Place 1 drop into the left eye 2 (two) times daily.      sucralfate (CARAFATE) 1 g tablet take 1 tab by four times daily 120 tablet 2   Travoprost, BAK Free, (TRAVATAN) 0.004 % SOLN ophthalmic solution Place 1 drop into both eyes at bedtime.      No facility-administered medications prior to visit.    Allergies  Allergen Reactions   Penicillins Rash    Has patient had a PCN reaction causing immediate rash, facial/tongue/throat swelling, SOB or lightheadedness with hypotension: Yes Has patient had a PCN reaction causing severe rash  involving mucus membranes or skin necrosis: No Has patient had a PCN reaction that required hospitalization No Has patient had a PCN reaction occurring within the last 10 years: No If all of the above answers are "NO", then may proceed with Cephalosporin use.     ROS Review of Systems    Objective:    Physical Exam  There were no vitals taken for this visit. Wt Readings from Last 3 Encounters:  05/24/18 203 lb 9.6 oz (92.4 kg)  03/21/18 197 lb (89.4 kg)  01/24/17 201 lb (91.2 kg)     Health Maintenance Due  Topic Date Due   COVID-19 Vaccine (1) Never done   Zoster Vaccines- Shingrix (1 of 2) Never done   PNA vac Low Risk Adult (2 of 2 - PCV13) 08/25/2017    There are no preventive care reminders to display for this patient.  Lab Results  Component Value Date   TSH 0.327 (L) 03/21/2018   Lab Results  Component Value Date   WBC 7.0 12/12/2020   HGB 14.6 12/12/2020   HCT 45.1 12/12/2020   MCV 94.4 12/12/2020   PLT 199 12/12/2020   Lab Results  Component Value Date   NA 140 12/12/2020   K 3.9 12/12/2020   CO2 28 12/12/2020   GLUCOSE 96 12/12/2020   BUN 17 12/12/2020   CREATININE 1.27 (H) 12/12/2020   BILITOT 0.8 12/12/2020   ALKPHOS 60 12/12/2020   AST 28 12/12/2020   ALT 21 12/12/2020   PROT 7.2 12/12/2020   ALBUMIN 3.3 (L) 12/12/2020   CALCIUM 8.8 (L) 12/12/2020   ANIONGAP 7 12/12/2020   Lab Results  Component Value Date   CHOL 167 03/21/2018   Lab Results  Component Value Date   HDL 41 03/21/2018   Lab Results  Component Value Date   LDLCALC 104 (H) 03/21/2018   Lab Results  Component Value Date   TRIG 111 03/21/2018   Lab Results  Component Value Date   CHOLHDL 4.1 03/21/2018   Lab Results  Component Value Date   HGBA1C 5.7 08/25/2016      Assessment & Plan:   Problem List Items Addressed This Visit   None   No orders of the defined types were placed in this encounter.   Follow-up: No follow-ups on file.    Asencion Noble, MD

## 2021-02-24 ENCOUNTER — Encounter: Payer: Self-pay | Admitting: Critical Care Medicine

## 2021-02-24 ENCOUNTER — Ambulatory Visit: Payer: Self-pay | Attending: Nurse Practitioner | Admitting: Critical Care Medicine

## 2021-02-24 ENCOUNTER — Other Ambulatory Visit: Payer: Self-pay

## 2021-02-24 DIAGNOSIS — Z91199 Patient's noncompliance with other medical treatment and regimen due to unspecified reason: Secondary | ICD-10-CM

## 2021-02-24 DIAGNOSIS — Z5329 Procedure and treatment not carried out because of patient's decision for other reasons: Secondary | ICD-10-CM

## 2021-02-24 NOTE — Progress Notes (Signed)
Patient ID: James Ayers, male   DOB: Feb 23, 1934, 87 y.o.   MRN: [id number] All numbers tried , no answer

## 2021-03-04 ENCOUNTER — Ambulatory Visit: Payer: Self-pay

## 2021-03-04 NOTE — Telephone Encounter (Signed)
Called patient's son to review questions via interpreter ID #[phone] . No answer, left voicemail to call back to clinic.

## 2021-03-04 NOTE — Telephone Encounter (Signed)
Pts son called to confirm pts appt but the appt was cancelled/ he is waiting for office explanation but he also mentioned that his father needs to be seen and is not doing much of anything/ he states his father just goes to the bathroom and back in bed / no other moving or walking/ please advise   Called patient's son to review information via interpretor ID [phone]. No answer, left message on voicemail to call clinic back at [phone].

## 2021-03-08 ENCOUNTER — Emergency Department (HOSPITAL_BASED_OUTPATIENT_CLINIC_OR_DEPARTMENT_OTHER)
Admission: EM | Admit: 2021-03-08 | Discharge: 2021-03-08 | Disposition: A | Discharge status: Home / Self Care | Payer: Medicaid Other | Attending: Emergency Medicine | Admitting: Emergency Medicine

## 2021-03-08 ENCOUNTER — Encounter (HOSPITAL_BASED_OUTPATIENT_CLINIC_OR_DEPARTMENT_OTHER): Payer: Self-pay

## 2021-03-08 ENCOUNTER — Emergency Department (HOSPITAL_BASED_OUTPATIENT_CLINIC_OR_DEPARTMENT_OTHER): Payer: Medicaid Other

## 2021-03-08 ENCOUNTER — Other Ambulatory Visit: Payer: Self-pay

## 2021-03-08 DIAGNOSIS — Z79899 Other long term (current) drug therapy: Secondary | ICD-10-CM | POA: Diagnosis not present

## 2021-03-08 DIAGNOSIS — K551 Chronic vascular disorders of intestine: Secondary | ICD-10-CM | POA: Diagnosis not present

## 2021-03-08 DIAGNOSIS — K2900 Acute gastritis without bleeding: Secondary | ICD-10-CM | POA: Insufficient documentation

## 2021-03-08 DIAGNOSIS — R630 Anorexia: Secondary | ICD-10-CM | POA: Insufficient documentation

## 2021-03-08 DIAGNOSIS — Z7982 Long term (current) use of aspirin: Secondary | ICD-10-CM | POA: Diagnosis not present

## 2021-03-08 DIAGNOSIS — I10 Essential (primary) hypertension: Secondary | ICD-10-CM | POA: Insufficient documentation

## 2021-03-08 DIAGNOSIS — R1084 Generalized abdominal pain: Secondary | ICD-10-CM | POA: Diagnosis present

## 2021-03-08 LAB — CBC WITH DIFFERENTIAL/PLATELET
Abs Immature Granulocytes: 0.04 10*3/uL (ref 0.00–0.07)
Basophils Absolute: 0 10*3/uL (ref 0.0–0.1)
Basophils Relative: 0 %
Eosinophils Absolute: 0.1 10*3/uL (ref 0.0–0.5)
Eosinophils Relative: 1 %
HCT: 46.6 % (ref 39.0–52.0)
Hemoglobin: 15.7 g/dL (ref 13.0–17.0)
Immature Granulocytes: 0 %
Lymphocytes Relative: 15 %
Lymphs Abs: 1.8 10*3/uL (ref 0.7–4.0)
MCH: 30.7 pg (ref 26.0–34.0)
MCHC: 33.7 g/dL (ref 30.0–36.0)
MCV: 91.2 fL (ref 80.0–100.0)
Monocytes Absolute: 1 10*3/uL (ref 0.1–1.0)
Monocytes Relative: 8 %
Neutro Abs: 9.1 10*3/uL — ABNORMAL HIGH (ref 1.7–7.7)
Neutrophils Relative %: 76 %
Platelets: 217 10*3/uL (ref 150–400)
RBC: 5.11 MIL/uL (ref 4.22–5.81)
RDW: 12.7 % (ref 11.5–15.5)
WBC: 12.1 10*3/uL — ABNORMAL HIGH (ref 4.0–10.5)
nRBC: 0 % (ref 0.0–0.2)

## 2021-03-08 LAB — LIPASE, BLOOD: Lipase: 19 U/L (ref 11–51)

## 2021-03-08 LAB — COMPREHENSIVE METABOLIC PANEL
ALT: 15 U/L (ref 0–44)
AST: 23 U/L (ref 15–41)
Albumin: 3.9 g/dL (ref 3.5–5.0)
Alkaline Phosphatase: 70 U/L (ref 38–126)
Anion gap: 13 (ref 5–15)
BUN: 16 mg/dL (ref 8–23)
CO2: 23 mmol/L (ref 22–32)
Calcium: 9.1 mg/dL (ref 8.9–10.3)
Chloride: 103 mmol/L (ref 98–111)
Creatinine, Ser: 1.39 mg/dL — ABNORMAL HIGH (ref 0.61–1.24)
GFR, Estimated: 50 mL/min — ABNORMAL LOW (ref >60)
Glucose, Bld: 112 mg/dL — ABNORMAL HIGH (ref 70–99)
Potassium: 4.3 mmol/L (ref 3.5–5.1)
Sodium: 139 mmol/L (ref 135–145)
Total Bilirubin: 1.9 mg/dL — ABNORMAL HIGH (ref 0.3–1.2)
Total Protein: 8 g/dL (ref 6.5–8.1)

## 2021-03-08 MED ORDER — SODIUM CHLORIDE 0.9 % IV BOLUS
1000.0000 mL | Freq: Once | INTRAVENOUS | Status: AC
  Administered 2021-03-08: 1000 mL via INTRAVENOUS

## 2021-03-08 MED ORDER — OMEPRAZOLE 40 MG PO CPDR
40.0000 mg | DELAYED_RELEASE_CAPSULE | Freq: Two times a day (BID) | ORAL | 0 refills | Status: DC
Start: 2021-03-08 — End: 2021-06-20
  Filled 2021-03-08: qty 60, 30d supply, fill #0

## 2021-03-08 MED ORDER — ONDANSETRON HCL 4 MG/2ML IJ SOLN
4.0000 mg | Freq: Once | INTRAMUSCULAR | Status: AC
  Administered 2021-03-08: 4 mg via INTRAVENOUS
  Filled 2021-03-08: qty 2

## 2021-03-08 MED ORDER — IOHEXOL 300 MG/ML  SOLN
100.0000 mL | Freq: Once | INTRAMUSCULAR | Status: AC | PRN
  Administered 2021-03-08: 100 mL via INTRAVENOUS

## 2021-03-08 NOTE — ED Notes (Signed)
ED Provider at bedside. 

## 2021-03-08 NOTE — ED Notes (Signed)
Assist to BR

## 2021-03-08 NOTE — ED Triage Notes (Signed)
"  N/v/d x 2 days, now really weak" per son

## 2021-03-08 NOTE — ED Notes (Signed)
Patient transported to CT 

## 2021-03-08 NOTE — ED Provider Notes (Addendum)
[facility] DEPARTMENT Provider Note   CSN: [id number] Arrival date & time: 03/08/21  0708     History Chief Complaint  Patient presents with   Nausea    N/V/D x 2 days    James Ayers is a 87 y.o. male.  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  He presents with a bout of abdominal pain, nausea, and diarrhea similar to prior episodes of the same.  He has had a colonoscopy in 2018 but has not seen gastroenterology recently due to his lack of health insurance.  Apparently, he has placed an application, but there has been some delay.  His son is not quite sure what the delay is.  Symptoms have worsened over the past 2 days.  He does state that he had some kind of parasite very remotely, but was treated for this.  The history is provided by the patient and a relative (son). History limited by: Speaks Arabic, very hard of hearing. No language interpreter was used (offered but preferred his son).  Abdominal Pain Pain location:  Generalized (more on the left side) Pain quality: cramping   Pain radiates to:  Does not radiate Pain severity:  Moderate Onset quality:  Gradual Duration:  2 days Timing:  Constant Progression:  Unchanged Chronicity:  Recurrent Context comment:  Suffers from nausea, diarrhea, and abdominal pain intermittently- unclear cause Relieved by:  Nothing Worsened by:  Eating Ineffective treatments:  OTC medications (immodium) Associated symptoms: anorexia, diarrhea, nausea and vomiting   Associated symptoms: no chest pain, no chills, no cough, no dysuria, no fever, no hematuria, no shortness of breath and no sore throat       Past Medical History:  Diagnosis Date   BPH (benign prostatic hyperplasia)    GERD (gastroesophageal reflux disease)    Glaucoma    Hearing loss    Hypertension    Osteoarthritis    both knee   Vitamin D deficiency     Patient Active Problem List   Diagnosis Date Noted   Language  barrier 12/09/2016   Bacterial infection due to H. pylori 12/09/2016   Abdominal tenderness, left lower quadrant 11/05/2016   Gastroesophageal reflux disease 11/05/2016   Hemorrhage of rectum and anus 11/05/2016   Essential hypertension 08/25/2016   Benign prostatic hyperplasia with lower urinary tract symptoms 08/25/2016   Chronic midline low back pain 08/25/2016    Past Surgical History:  Procedure Laterality Date   CATARACT EXTRACTION     COLONOSCOPY WITH PROPOFOL N/A 11/05/2016   Procedure: COLONOSCOPY WITH PROPOFOL;  Surgeon: Wilford Corner, MD;  Location: [facility];  Service: Endoscopy;  Laterality: N/A;   ESOPHAGOGASTRODUODENOSCOPY (EGD) WITH PROPOFOL N/A 11/05/2016   Procedure: ESOPHAGOGASTRODUODENOSCOPY (EGD) WITH PROPOFOL;  Surgeon: Wilford Corner, MD;  Location: [facility];  Service: Endoscopy;  Laterality: N/A;   EYE SURGERY Bilateral 2007       Family History  Problem Relation Age of Onset   Diabetes Neg Hx     Social History   Tobacco Use   Smoking status: Never   Smokeless tobacco: Never  Vaping Use   Vaping Use: Never used  Substance Use Topics   Alcohol use: No   Drug use: No    Home Medications Prior to Admission medications   Medication Sig Start Date End Date Taking? Authorizing Provider  acetaminophen (TYLENOL) 325 MG tablet Take 650 mg by mouth every 6 (six) hours as needed for moderate pain or headache.    [provider]  amLODipine (NORVASC) 5 MG tablet Take 1 tablet (5 mg total) by mouth daily. 05/25/18   Gildardo Pounds, NP  aspirin EC 81 MG tablet Take 81 mg by mouth daily.    [provider]  brimonidine (ALPHAGAN) 0.2 % ophthalmic solution Place 1 drop into both eyes 2 (two) times daily.     [provider]  diclofenac Sodium (VOLTAREN) 1 % GEL Apply 2 g topically 4 (four) times daily as needed. 12/13/20   Long, Wonda Olds, MD  dicyclomine (BENTYL) 20 MG tablet Take 1 tablet (20 mg total) by mouth 3 (three)  times daily as needed for spasms. 12/13/20   Long, Wonda Olds, MD  dorzolamide (TRUSOPT) 2 % ophthalmic solution Place 1 drop into both eyes 2 (two) times daily.    [provider]  doxazosin (CARDURA) 4 MG tablet Take 1 tablet (4 mg total) by mouth at bedtime. 05/24/18   Gildardo Pounds, NP  finasteride (PROSCAR) 5 MG tablet Take 1 tablet (5 mg total) by mouth at bedtime. 05/24/18   Gildardo Pounds, NP  lisinopril (PRINIVIL,ZESTRIL) 10 MG tablet Take 1 tablet (10 mg total) by mouth daily. 05/24/18   Gildardo Pounds, NP  loperamide (IMODIUM) 2 MG capsule Take 1 capsule (2 mg total) by mouth 4 (four) times daily as needed for diarrhea or loose stools. 12/13/20   Long, Wonda Olds, MD  omeprazole (PRILOSEC) 40 MG capsule take 1 cap by mouth once daily 01/09/21     omeprazole (PRILOSEC) 40 MG capsule take one capsule by mouth daily 01/09/21     prednisoLONE acetate (PRED FORTE) 1 % ophthalmic suspension Place 1 drop into the left eye 2 (two) times daily.     [provider]  sucralfate (CARAFATE) 1 g tablet take 1 tab by four times daily 01/09/21     Travoprost, BAK Free, (TRAVATAN) 0.004 % SOLN ophthalmic solution Place 1 drop into both eyes at bedtime.     [provider]    Allergies    Penicillins  Review of Systems   Review of Systems  Constitutional:  Negative for chills and fever.  HENT:  Negative for ear pain and sore throat.   Eyes:  Negative for pain and visual disturbance.  Respiratory:  Negative for cough and shortness of breath.   Cardiovascular:  Negative for chest pain and palpitations.  Gastrointestinal:  Positive for abdominal pain, anorexia, diarrhea, nausea and vomiting.  Genitourinary:  Negative for dysuria and hematuria.  Musculoskeletal:  Negative for arthralgias and back pain.  Skin:  Negative for color change and rash.  Neurological:  Negative for seizures and syncope.  All other systems reviewed and are negative.  Physical Exam Updated Vital  Signs BP (!) 186/90   Pulse 84   Temp 98.6 F (37 C) (Oral)   Resp 16   Ht '6\' 2"'$  (1.88 m)   Wt 99.8 kg   SpO2 100%   BMI 28.25 kg/m   Physical Exam Vitals and nursing note reviewed.  Constitutional:      Appearance: Normal appearance.  HENT:     Head: Normocephalic and atraumatic.  Cardiovascular:     Rate and Rhythm: Normal rate and regular rhythm.     Heart sounds: Normal heart sounds.  Pulmonary:     Effort: Pulmonary effort is normal.     Breath sounds: Normal breath sounds.  Abdominal:     General: There is distension.     Palpations: Abdomen is  soft.     Tenderness: There is abdominal tenderness (LUQ, LLQ). There is no guarding.  Musculoskeletal:     Cervical back: Normal range of motion.     Right lower leg: No edema.     Left lower leg: No edema.  Skin:    General: Skin is warm and dry.  Neurological:     General: No focal deficit present.     Mental Status: He is alert and oriented to person, place, and time.  Psychiatric:        Mood and Affect: Mood normal.        Behavior: Behavior normal.    ED Results / Procedures / Treatments   Labs (all labs ordered are listed, but only abnormal results are displayed) Labs Reviewed  COMPREHENSIVE METABOLIC PANEL - Abnormal; Notable for the following components:      Result Value   Glucose, Bld 112 (*)    Creatinine, Ser 1.39 (*)    Total Bilirubin 1.9 (*)    GFR, Estimated 50 (*)    All other components within normal limits  CBC WITH DIFFERENTIAL/PLATELET - Abnormal; Notable for the following components:   WBC 12.1 (*)    Neutro Abs 9.1 (*)    All other components within normal limits  GASTROINTESTINAL PANEL BY PCR, STOOL (REPLACES STOOL CULTURE)  LIPASE, BLOOD    EKG EKG Interpretation  Date/Time:  Saturday March 08 2021 08:25:10 EDT Ventricular Rate:  71 PR Interval:  208 QRS Duration: 83 QT Interval:  400 QTC Calculation: 435 R Axis:   97 Text Interpretation: Sinus rhythm Right axis deviation No  acute ischemia Similar to prior Confirmed by Lorre Munroe ([id number]) on 03/08/2021 9:40:47 AM  Radiology CT Abdomen Pelvis W Contrast  Result Date: 03/08/2021 CLINICAL DATA:  Nausea and vomiting for 2 days.  Weakness. EXAM: CT ABDOMEN AND PELVIS WITH CONTRAST TECHNIQUE: Multidetector CT imaging of the abdomen and pelvis was performed using the standard protocol following bolus administration of intravenous contrast. CONTRAST:  179m OMNIPAQUE IOHEXOL 300 MG/ML  SOLN COMPARISON:  12/13/2020 FINDINGS: Minimal motion degradation throughout. Lower chest: Right base scarring. Bibasilar dependent subsegmental atelectasis. Normal heart size without pericardial or pleural effusion. Hepatobiliary: Normal liver. Normal gallbladder, without biliary ductal dilatation. Pancreas: Normal pancreas for age, with atrophy but no acute inflammation. Spleen: Normal in size, without focal abnormality. Adrenals/Urinary Tract: Normal adrenal glands. Mild bilateral renal cortical thinning. Low-density right renal lesions are most consistent with cysts. Maximally 2.9 cm in the upper pole right kidney. No hydronephrosis. Normal urinary bladder. Stomach/Bowel: Tiny hiatal hernia. Gastric antral underdistention with apparent moderate wall thickening including on 19/2. The colon is fluid-filled, suggesting a diarrheal state. Normal terminal ileum and appendix. Normal small bowel. Vascular/Lymphatic: Aortic atherosclerosis. The IMA is hypoenhancing including on 40/2, similar on the prior. Celiac and SMA both patent. No abdominopelvic adenopathy. Reproductive: Moderate prostatomegaly. Other: No significant free fluid. Ventral abdominal wall adjacent hernias are small and contain fat, including on 48/2. Musculoskeletal: Degenerative partial fusion of the left sacroiliac joint. IMPRESSION: 1. Fluid-filled colon, suggesting a diarrheal state. No other explanation for patient's symptoms. 2. Gastric antral wall thickening, accentuated by  underdistention. Findings suspicious for gastritis. 3. Tiny hiatal hernia. 4. Prostatomegaly. 5. Aortic Atherosclerosis (ICD10-I70.0). 6. Similar inferior mesenteric artery hypoenhancement, suggesting significant stenosis to near occlusion. The celiac and SMA are both patent. Electronically Signed   By: KAbigail MiyamotoM.D.   On: 03/08/2021 09:54    Procedures Procedures   Medications Ordered in ED  Medications  ondansetron (ZOFRAN) injection 4 mg (4 mg Intravenous Given 03/08/21 0818)  sodium chloride 0.9 % bolus 1,000 mL (1,000 mLs Intravenous New Bag/Given 03/08/21 [id number])    ED Course  I have reviewed the triage vital signs and the nursing notes.  Pertinent labs & imaging results that were available during my care of the patient were reviewed by me and considered in my medical decision making (see chart for details).  Clinical Course as of 03/08/21 1053  Sat Mar 08, 2021  1011 I spoke with Dr. Zenia Resides of General Surgery. No indication for acute surgical management. Vascular surgery could answer question of anticoagulation better. [AW]  1026 I spoke with Dr. Scot Dock, vascular surgery, via nurse in the OR. He will help arrange follow-up for this patient. [AW]    Clinical Course User Index [AW] Arnaldo Natal, MD   MDM Rules/Calculators/A&P                           James Ayers resents with acute exacerbation of relatively chronic abdominal pain and diarrhea.  He has a mild elevation in his white blood cell count, but otherwise his blood work is fairly unremarkable.  He was unable to produce a stool sample.  Testing for infectious causes was not possible.  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  This could be causing his frequent abdominal pain especially as he endorses hesitancy to eat and increased pain with eating.  Vascular surgery follow-up will be arranged.  He also has evidence of gastritis on his CT scan, and he has had a history of this in  the past.  It looks like he never picked up his prescribed Prilosec, and I have asked him to increase this to twice daily for 1 month.  He may need gastroenterology follow-up as well.  One of his barriers his insurance coverage, and transition of care consult was placed for further help with this issue.  To the patient's daughter-in-law on the phone.  She was able to understand the ED findings and follow-up recommendations.  She also added that the patient is not taking his blood pressure medications.  He has experienced postural hypotension and does not want to take the antihypertensives. Final Clinical Impression(s) / ED Diagnoses Final diagnoses:  Chronic mesenteric ischemia ([place])  Acute gastritis without hemorrhage, unspecified gastritis type    Rx / DC Orders ED Discharge Orders          Ordered    Consult to Transition of Care Team       Comments: Needs help with obtaining insurance or checking on his application. This is preventing him from seeing a gastroenterologist, and he has had multiple ED visits for chronic diarrhea, nausea, and pain.  Provider:  (Not yet assigned)   03/08/21 [id number]             Arnaldo Natal, MD 03/08/21 1031    Arnaldo Natal, MD 03/08/21 1055

## 2021-03-10 ENCOUNTER — Other Ambulatory Visit: Payer: Self-pay

## 2021-03-11 ENCOUNTER — Other Ambulatory Visit: Payer: Self-pay

## 2021-03-12 ENCOUNTER — Telehealth (HOSPITAL_BASED_OUTPATIENT_CLINIC_OR_DEPARTMENT_OTHER): Payer: Self-pay | Admitting: Emergency Medicine

## 2021-03-12 NOTE — Telephone Encounter (Signed)
Vascular surgery ambulatory referral placed

## 2021-03-20 ENCOUNTER — Other Ambulatory Visit: Payer: Self-pay

## 2021-03-20 ENCOUNTER — Encounter: Payer: Self-pay | Admitting: Internal Medicine

## 2021-03-20 ENCOUNTER — Ambulatory Visit: Payer: Medicaid Other | Attending: Internal Medicine | Admitting: Internal Medicine

## 2021-03-20 VITALS — BP 163/81 | HR 80 | Ht 74.0 in

## 2021-03-20 DIAGNOSIS — G8929 Other chronic pain: Secondary | ICD-10-CM | POA: Diagnosis present

## 2021-03-20 DIAGNOSIS — Z880 Allergy status to penicillin: Secondary | ICD-10-CM | POA: Insufficient documentation

## 2021-03-20 DIAGNOSIS — K219 Gastro-esophageal reflux disease without esophagitis: Secondary | ICD-10-CM | POA: Diagnosis not present

## 2021-03-20 DIAGNOSIS — Z79899 Other long term (current) drug therapy: Secondary | ICD-10-CM | POA: Diagnosis not present

## 2021-03-20 DIAGNOSIS — H9193 Unspecified hearing loss, bilateral: Secondary | ICD-10-CM | POA: Diagnosis not present

## 2021-03-20 DIAGNOSIS — H409 Unspecified glaucoma: Secondary | ICD-10-CM | POA: Insufficient documentation

## 2021-03-20 DIAGNOSIS — M170 Bilateral primary osteoarthritis of knee: Secondary | ICD-10-CM

## 2021-03-20 DIAGNOSIS — Z8669 Personal history of other diseases of the nervous system and sense organs: Secondary | ICD-10-CM | POA: Diagnosis not present

## 2021-03-20 DIAGNOSIS — I10 Essential (primary) hypertension: Secondary | ICD-10-CM | POA: Insufficient documentation

## 2021-03-20 DIAGNOSIS — Z7982 Long term (current) use of aspirin: Secondary | ICD-10-CM | POA: Insufficient documentation

## 2021-03-20 DIAGNOSIS — R109 Unspecified abdominal pain: Secondary | ICD-10-CM | POA: Diagnosis not present

## 2021-03-20 MED ORDER — DICLOFENAC SODIUM 1 % EX GEL
2.0000 g | Freq: Four times a day (QID) | CUTANEOUS | 0 refills | Status: DC | PRN
  Filled 2021-03-20: qty 100, 13d supply, fill #0

## 2021-03-20 NOTE — Progress Notes (Signed)
Patient ID: James Ayers, male    DOB: 03-02-1934  MRN: [id number]  CC: Abdominal Pain   Subjective: James Ayers is a 87 y.o. male who presents for UC visit.  Son Alene Mires is with him and gives most of the hx. patient is very hard of hearing which makes it difficult to communicate with him.  He does speak some [place]. His concerns today include:  Pt with hx of HTN, GERD, BPH, chronic back pain  Pt presents as f/u from ER where he was seen for abdominal pain, nausea and diarrhea that had worsened over 2 days.  On exam his abdomen was distended there was tenderness in the left upper and lower quadrants.  Lipase level normal.  CBC with mild elevation in WBC.  He had mild elevation in total bilirubin and other liver enzymes were normal.  Creatinine was 1.39 which seems to be around his baseline.  CAT scan of the abdomen revealed fluid-filled colon suggesting a diarrheal state, gastric antral wall thickening suggesting gastritis and inferior mesenteric artery hypoenhancement suggesting significant stenosis to near occlusion.  Contact was made with vascular surgeon due to concerns of chronic abdominal pain due to chronic mesenteric ischemia.  Outpatient follow-up recommended.  Son states that they have an appointment scheduled with the vascular surgeon on the 17th of this month.  Today: Patient reports that the diarrhea has stopped.  He denies any pain with eating but son states that he is afraid to eat certain foods because they cause diarrhea for him.  These foods include meat, certain fruits and milk.  I did not get a clear answer as to whether he has loss any weight recently.  However review of his chart shows that he was 203 pounds in 2019 and 220 pounds on recent weight last month. He had colonoscopy and endoscopy in 2018 by Dr. Michail Sermon.  Patient is very hard of hearing which made it difficult to communicate with him.  Son indicates that it has been like this for a while. I inquired where he  has seen an ENT specialist before.  He has not.  Son would like for him to be referred to an ENT specialist now that he has Medicaid.  Son also requests referral to ophthalmology.  It sounds as though patient has glaucoma and very limited vision in the left eye.  He has eyedrops which he brought with him from [place] that is soon to run out.  His son does not have the names or the medications with him.  Patient reports pain in his knees right greater than left.  He has known arthritis.  This affects his ability to ambulate and has difficulty standing.  Son reports he gets some relief with Voltaren gel but needs a refill.  X-rays of the knees done back in 2018 revealed moderate to advanced degenerative arthritis changes. Patient Active Problem List   Diagnosis Date Noted   Language barrier 12/09/2016   Bacterial infection due to H. pylori 12/09/2016   Abdominal tenderness, left lower quadrant 11/05/2016   Gastroesophageal reflux disease 11/05/2016   Hemorrhage of rectum and anus 11/05/2016   Essential hypertension 08/25/2016   Benign prostatic hyperplasia with lower urinary tract symptoms 08/25/2016   Chronic midline low back pain 08/25/2016     Current Outpatient Medications on File Prior to Visit  Medication Sig Dispense Refill   acetaminophen (TYLENOL) 325 MG tablet Take 650 mg by mouth every 6 (six) hours as needed for moderate pain or headache.  amLODipine (NORVASC) 5 MG tablet Take 1 tablet (5 mg total) by mouth daily. 90 tablet 3   aspirin EC 81 MG tablet Take 81 mg by mouth daily.     brimonidine (ALPHAGAN) 0.2 % ophthalmic solution Place 1 drop into both eyes 2 (two) times daily.      dicyclomine (BENTYL) 20 MG tablet Take 1 tablet (20 mg total) by mouth 3 (three) times daily as needed for spasms. 20 tablet 0   dorzolamide (TRUSOPT) 2 % ophthalmic solution Place 1 drop into both eyes 2 (two) times daily.     doxazosin (CARDURA) 4 MG tablet Take 1 tablet (4 mg total) by mouth at  bedtime. 90 tablet 2   finasteride (PROSCAR) 5 MG tablet Take 1 tablet (5 mg total) by mouth at bedtime. 90 tablet 2   lisinopril (PRINIVIL,ZESTRIL) 10 MG tablet Take 1 tablet (10 mg total) by mouth daily. 90 tablet 2   loperamide (IMODIUM) 2 MG capsule Take 1 capsule (2 mg total) by mouth 4 (four) times daily as needed for diarrhea or loose stools. 12 capsule 0   omeprazole (PRILOSEC) 40 MG capsule Take 1 capsule (40 mg total) by mouth in the morning and at bedtime. 60 capsule 0   prednisoLONE acetate (PRED FORTE) 1 % ophthalmic suspension Place 1 drop into the left eye 2 (two) times daily.      sucralfate (CARAFATE) 1 g tablet take 1 tab by four times daily 120 tablet 2   Travoprost, BAK Free, (TRAVATAN) 0.004 % SOLN ophthalmic solution Place 1 drop into both eyes at bedtime.      No current facility-administered medications on file prior to visit.    Allergies  Allergen Reactions   Penicillins Rash    Has patient had a PCN reaction causing immediate rash, facial/tongue/throat swelling, SOB or lightheadedness with hypotension: Yes Has patient had a PCN reaction causing severe rash involving mucus membranes or skin necrosis: No Has patient had a PCN reaction that required hospitalization No Has patient had a PCN reaction occurring within the last 10 years: No If all of the above answers are "NO", then may proceed with Cephalosporin use.     Social History   Socioeconomic History   Marital status: Married    Spouse name: Not on file   Number of children: Not on file   Years of education: Not on file   Highest education level: Not on file  Occupational History   Not on file  Tobacco Use   Smoking status: Never   Smokeless tobacco: Never  Vaping Use   Vaping Use: Never used  Substance and Sexual Activity   Alcohol use: No   Drug use: No   Sexual activity: Never  Other Topics Concern   Not on file  Social History Narrative   Not on file   Social Determinants of Health    Financial Resource Strain: Not on file  Food Insecurity: Not on file  Transportation Needs: Not on file  Physical Activity: Not on file  Stress: Not on file  Social Connections: Not on file  Intimate Partner Violence: Not on file    Family History  Problem Relation Age of Onset   Diabetes Neg Hx     Past Surgical History:  Procedure Laterality Date   CATARACT EXTRACTION     COLONOSCOPY WITH PROPOFOL N/A 11/05/2016   Procedure: COLONOSCOPY WITH PROPOFOL;  Surgeon: Wilford Corner, MD;  Location: [facility];  Service: Endoscopy;  Laterality: N/A;   ESOPHAGOGASTRODUODENOSCOPY (  EGD) WITH PROPOFOL N/A 11/05/2016   Procedure: ESOPHAGOGASTRODUODENOSCOPY (EGD) WITH PROPOFOL;  Surgeon: Wilford Corner, MD;  Location: [facility];  Service: Endoscopy;  Laterality: N/A;   EYE SURGERY Bilateral 2007    ROS: Review of Systems Negative except as stated above  PHYSICAL EXAM: BP (!) 163/81   Pulse 80   Ht '6\' 2"'$  (1.88 m)   SpO2 96%   BMI 28.25 kg/m   Wt Readings from Last 3 Encounters:  03/08/21 220 lb (99.8 kg)  05/24/18 203 lb 9.6 oz (92.4 kg)  03/21/18 197 lb (89.4 kg)   Physical Exam   General appearance - alert, well appearing, elderly male and in no distress.  He is sitting in a wheelchair.  We did not attempt to get him on the exam table. He has a rod with him Mental status -it is difficult getting history from patient because he is significantly hard of hearing.  Even with shouting, patient was unable to hear most of my questions. Eyes: Cloudy appearance to both lenses. Abdomen -bowel sounds slightly decreased.  Soft.  Nontender. Musculoskeletal -knee joints are enlarged.  He has moderate crepitus and discomfort with attempted passive range of motion.  CMP Latest Ref Rng & Units 03/08/2021 12/12/2020 03/21/2018  Glucose 70 - 99 mg/dL 112(H) 96 100(H)  BUN 8 - 23 mg/dL 16 17 30(H)  Creatinine 0.61 - 1.24 mg/dL 1.39(H) 1.27(H) 1.43(H)  Sodium 135 - 145 mmol/L 139 140 142   Potassium 3.5 - 5.1 mmol/L 4.3 3.9 4.7  Chloride 98 - 111 mmol/L 103 105 105  CO2 22 - 32 mmol/L '23 28 22  '$ Calcium 8.9 - 10.3 mg/dL 9.1 8.8(L) 9.2  Total Protein 6.5 - 8.1 g/dL 8.0 7.2 7.7  Total Bilirubin 0.3 - 1.2 mg/dL 1.9(H) 0.8 0.7  Alkaline Phos 38 - 126 U/L 70 60 82  AST 15 - 41 U/L '23 28 8  '$ ALT 0 - 44 U/L '15 21 9   '$ Lipid Panel     Component Value Date/Time   CHOL 167 03/21/2018 1548   TRIG 111 03/21/2018 1548   HDL 41 03/21/2018 1548   CHOLHDL 4.1 03/21/2018 1548   CHOLHDL 4.7 Ratio 01/06/2008 2242   VLDL 34 01/06/2008 2242   LDLCALC 104 (H) 03/21/2018 1548    CBC    Component Value Date/Time   WBC 12.1 (H) 03/08/2021 0815   RBC 5.11 03/08/2021 0815   HGB 15.7 03/08/2021 0815   HGB 15.3 03/21/2018 1548   HCT 46.6 03/08/2021 0815   HCT 45.8 03/21/2018 1548   PLT 217 03/08/2021 0815   PLT 200 03/21/2018 1548   MCV 91.2 03/08/2021 0815   MCV 90 03/21/2018 1548   MCH 30.7 03/08/2021 0815   MCHC 33.7 03/08/2021 0815   RDW 12.7 03/08/2021 0815   RDW 13.6 03/21/2018 1548   LYMPHSABS 1.8 03/08/2021 0815   MONOABS 1.0 03/08/2021 0815   EOSABS 0.1 03/08/2021 0815   BASOSABS 0.0 03/08/2021 0815    ASSESSMENT AND PLAN: 1. Chronic abdominal pain -Patient to keep appointment with vascular surgeon next week.  I think we should also get him back in with the gastroenterologist whom he saw in 2018 -I recommend avoiding fatty foods and trying to change to 1% milk or Lactaid milk - Ambulatory referral to Gastroenterology  2. Hearing impaired person, bilateral - Ambulatory referral to ENT  3. History of glaucoma Message sent to our referral coordinator to try to get him in with Dr. Schuyler Amor ASAP - Ambulatory  referral to Ophthalmology  4. Primary osteoarthritis of both knees -This is significantly affecting his quality of life. Refill given on Voltaren gel.  I will refer him to orthopedics to be assessed for steroid injection versus need for surgical option. - diclofenac  Sodium (VOLTAREN) 1 % GEL; Apply 2 g topically 4 (four) times daily as needed.  Dispense: 100 g; Refill: 0 - Ambulatory referral to Orthopedic Surgery  F/u with his regular provider in 2 wks. Son would like to discuss PCS services.  Patient was given the opportunity to ask questions.  Patient verbalized understanding of the plan and was able to repeat key elements of the plan.   Orders Placed This Encounter  Procedures   Ambulatory referral to Gastroenterology   Ambulatory referral to ENT   Ambulatory referral to Ophthalmology   Ambulatory referral to Orthopedic Surgery     Requested Prescriptions   Signed Prescriptions Disp Refills   diclofenac Sodium (VOLTAREN) 1 % GEL 100 g 0    Sig: Apply 2 g topically 4 (four) times daily as needed.    No follow-ups on file.  Karle Plumber, MD, FACP

## 2021-03-24 ENCOUNTER — Ambulatory Visit: Payer: Self-pay | Admitting: Nurse Practitioner

## 2021-03-26 ENCOUNTER — Ambulatory Visit (INDEPENDENT_AMBULATORY_CARE_PROVIDER_SITE_OTHER): Payer: Medicaid Other | Admitting: Vascular Surgery

## 2021-03-26 ENCOUNTER — Encounter: Payer: Self-pay | Admitting: Vascular Surgery

## 2021-03-26 ENCOUNTER — Other Ambulatory Visit: Payer: Self-pay

## 2021-03-26 VITALS — BP 145/78 | HR 69 | Temp 97.9°F | Resp 20 | Ht 74.0 in | Wt 220.0 lb

## 2021-03-26 DIAGNOSIS — K551 Chronic vascular disorders of intestine: Secondary | ICD-10-CM | POA: Diagnosis not present

## 2021-03-26 NOTE — Progress Notes (Signed)
ASSESSMENT & PLAN   STENOSIS OF INFERIOR MESENTERIC ARTERY: Based on the CT scan on 03/08/2021 there is poor filling of the inferior mesenteric artery.  However the superior mesenteric artery and celiac axis are widely patent.  Thus I do not think his abdominal symptoms can be attributed to chronic mesenteric ischemia.  Likewise his symptoms do not fit with symptoms from chronic mesenteric ischemia.  He does not have any postprandial abdominal pain.  He has not had any significant weight loss.  I do not think any further vascular work-up is indicated based on his CT findings and history.  The family is requesting we refer him to gastroenterology.  I think this is reasonable given that he did have some abnormalities on his colon and stomach on his CT scan.  We will try to make that referral.  REASON FOR CONSULT:    Rule out mesenteric ischemia.  The consult is requested by the emergency department ([facility])  HPI:   James Ayers is a 87 y.o. male who had presented to the emergency department with abdominal pain, nausea, and diarrhea.  On exam he had some abdominal distention and pain in the left upper and left lower quadrants.  His creatinine was 1.39.  A CT scan of the abdomen was obtained which showed fluid-filled colon suggesting a diarrheal state.  In addition there was gastric antral wall thickening suggesting gastritis.  In addition there was evidence of stenosis in the inferior mesenteric artery.  Patient is sent for vascular consultation.  The history is obtained through the son.  The patient is very hard of hearing and therefore the history is very difficult to obtain.  However, the patient states that he has had some chronic abdominal pain which is worse when he sitting, standing, or walking.  He does not describe any postprandial abdominal pain.  He has not had any significant weight loss.  His only real risk factor for peripheral vascular disease is hypertension.  He denies  any history of diabetes, hypercholesterolemia, family history of premature cardiovascular disease, or tobacco use.  I do not get any history of claudication or rest pain.  Past Medical History:  Diagnosis Date   BPH (benign prostatic hyperplasia)    GERD (gastroesophageal reflux disease)    Glaucoma    Hearing loss    Hypertension    Osteoarthritis    both knee   Vitamin D deficiency     Family History  Problem Relation Age of Onset   Diabetes Neg Hx     SOCIAL HISTORY: Social History   Tobacco Use   Smoking status: Never   Smokeless tobacco: Never  Substance Use Topics   Alcohol use: No    Allergies  Allergen Reactions   Penicillins Rash    Has patient had a PCN reaction causing immediate rash, facial/tongue/throat swelling, SOB or lightheadedness with hypotension: Yes Has patient had a PCN reaction causing severe rash involving mucus membranes or skin necrosis: No Has patient had a PCN reaction that required hospitalization No Has patient had a PCN reaction occurring within the last 10 years: No If all of the above answers are "NO", then may proceed with Cephalosporin use.     Current Outpatient Medications  Medication Sig Dispense Refill   acetaminophen (TYLENOL) 325 MG tablet Take 650 mg by mouth every 6 (six) hours as needed for moderate pain or headache.     amLODipine (NORVASC) 5 MG tablet Take 1 tablet (5 mg total) by  mouth daily. 90 tablet 3   aspirin EC 81 MG tablet Take 81 mg by mouth daily.     brimonidine (ALPHAGAN) 0.2 % ophthalmic solution Place 1 drop into both eyes 2 (two) times daily.      diclofenac Sodium (VOLTAREN) 1 % GEL Apply 2 g topically 4 (four) times daily as needed. 100 g 0   dicyclomine (BENTYL) 20 MG tablet Take 1 tablet (20 mg total) by mouth 3 (three) times daily as needed for spasms. 20 tablet 0   dorzolamide (TRUSOPT) 2 % ophthalmic solution Place 1 drop into both eyes 2 (two) times daily.     doxazosin (CARDURA) 4 MG tablet Take 1  tablet (4 mg total) by mouth at bedtime. 90 tablet 2   finasteride (PROSCAR) 5 MG tablet Take 1 tablet (5 mg total) by mouth at bedtime. 90 tablet 2   lisinopril (PRINIVIL,ZESTRIL) 10 MG tablet Take 1 tablet (10 mg total) by mouth daily. 90 tablet 2   loperamide (IMODIUM) 2 MG capsule Take 1 capsule (2 mg total) by mouth 4 (four) times daily as needed for diarrhea or loose stools. 12 capsule 0   omeprazole (PRILOSEC) 40 MG capsule Take 1 capsule (40 mg total) by mouth in the morning and at bedtime. 60 capsule 0   prednisoLONE acetate (PRED FORTE) 1 % ophthalmic suspension Place 1 drop into the left eye 2 (two) times daily.      sucralfate (CARAFATE) 1 g tablet take 1 tab by four times daily 120 tablet 2   Travoprost, BAK Free, (TRAVATAN) 0.004 % SOLN ophthalmic solution Place 1 drop into both eyes at bedtime.      No current facility-administered medications for this visit.    REVIEW OF SYSTEMS:  '[X]'$  denotes positive finding, '[ ]'$  denotes negative finding Cardiac  Comments:  Chest pain or chest pressure:    Shortness of breath upon exertion:    Short of breath when lying flat:    Irregular heart rhythm:        Vascular    Pain in calf, thigh, or hip brought on by ambulation:    Pain in feet at night that wakes you up from your sleep:     Blood clot in your veins:    Leg swelling:         Pulmonary    Oxygen at home:    Productive cough:     Wheezing:         Neurologic    Sudden weakness in arms or legs:     Sudden numbness in arms or legs:     Sudden onset of difficulty speaking or slurred speech:    Temporary loss of vision in one eye:     Problems with dizziness:         Gastrointestinal    Blood in stool:     Vomited blood:         Genitourinary    Burning when urinating:     Blood in urine:        Psychiatric    Major depression:         Hematologic    Bleeding problems:    Problems with blood clotting too easily:        Skin    Rashes or ulcers:         Constitutional    Fever or chills:    -  PHYSICAL EXAM:   Vitals:   03/26/21 1245  BP: (!) 145/78  Pulse: 69  Resp: 20  Temp: 97.9 F (36.6 C)  SpO2: 96%  Weight: 220 lb (99.8 kg)  Height: '6\' 2"'$  (1.88 m)   Body mass index is 28.25 kg/m. GENERAL: The patient is a well-nourished male, in no acute distress. The vital signs are documented above. CARDIAC: There is a regular rate and rhythm.  VASCULAR: I do not detect carotid bruits. We could not get him up on the table so I had to examine him in the wheelchair.  He did have a dorsalis pedis and posterior tibial signal with a Doppler bilaterally.  Both feet were warm and well-perfused.  He has no significant lower extremity swelling. PULMONARY: There is good air exchange bilaterally without wheezing or rales. ABDOMEN: Soft and non-tender with normal pitched bowel sounds.  I did not palpate an aneurysm.  He had no significant abdominal tenderness on exam. MUSCULOSKELETAL: There are no major deformities. NEUROLOGIC: No focal weakness or paresthesias are detected. SKIN: There are no ulcers or rashes noted. PSYCHIATRIC: The patient has a normal affect.  DATA:    CT ANGIOGRAM ABDOMEN PELVIS: I have reviewed the CT angiogram of the abdomen pelvis that was done on 03/08/2021.  This shows that his celiac axis and superior mesenteric artery are widely patent.  The IMA appears occluded.   James Ayers [facility]

## 2021-03-27 ENCOUNTER — Encounter: Payer: Self-pay | Admitting: Orthopedic Surgery

## 2021-03-27 ENCOUNTER — Ambulatory Visit: Payer: Self-pay

## 2021-03-27 ENCOUNTER — Ambulatory Visit (INDEPENDENT_AMBULATORY_CARE_PROVIDER_SITE_OTHER): Payer: Medicaid Other | Admitting: Physician Assistant

## 2021-03-27 DIAGNOSIS — M25562 Pain in left knee: Secondary | ICD-10-CM | POA: Diagnosis not present

## 2021-03-27 DIAGNOSIS — G8929 Other chronic pain: Secondary | ICD-10-CM

## 2021-03-27 DIAGNOSIS — M25561 Pain in right knee: Secondary | ICD-10-CM | POA: Diagnosis not present

## 2021-03-27 NOTE — Progress Notes (Signed)
Office Visit Note   Patient: James Ayers           Date of Birth: 09/22/1933           MRN: [id number] Visit Date: 03/27/2021              Requested by: Ladell Pier, MD [address] [place],  [place] [address] PCP: Gildardo Pounds, NP  Chief Complaint  Patient presents with  . Left Knee - Pain  . Right Knee - Pain      HPI: Patient is a pleasant 87 year old gentleman who is here with an interpreter and his son.  He has a long history of bilateral knee pain.  He denies any recent injury.  This has been helped in the past with steroid injections  Assessment & Plan: Visit Diagnoses:  1. Chronic pain of both knees     Plan: We will go forward with bilateral knee injections today should follow-up in 3 weeks  Follow-Up Instructions: No follow-ups on file.   Ortho Exam  Patient is alert, oriented, no adenopathy, well-dressed, normal affect, normal respiratory effort. Bilateral knees no effusion no swelling he does have more varus alignment of the right greater than left.  He is tender around the medial lateral joint line.  Good varus valgus stability  Imaging: No results found. No images are attached to the encounter.  Labs: Lab Results  Component Value Date   HGBA1C 5.7 08/25/2016   ESRSEDRATE 21 (H) 01/06/2008   LABURIC 7.5 08/25/2016   LABURIC 7.4 01/06/2008     Lab Results  Component Value Date   ALBUMIN 3.9 03/08/2021   ALBUMIN 3.3 (L) 12/12/2020   ALBUMIN 4.3 03/21/2018    No results found for: MG Lab Results  Component Value Date   VD25OH 28.5 (L) 03/21/2018   VD25OH 21 (L) 08/25/2016    No results found for: PREALBUMIN CBC EXTENDED Latest Ref Rng & Units 03/08/2021 12/12/2020 03/21/2018  WBC 4.0 - 10.5 K/uL 12.1(H) 7.0 9.0  RBC 4.22 - 5.81 MIL/uL 5.11 4.78 5.07  HGB 13.0 - 17.0 g/dL 15.7 14.6 15.3  HCT 39.0 - 52.0 % 46.6 45.1 45.8  PLT 150 - 400 K/uL 217 199 200  NEUTROABS 1.7 - 7.7 K/uL 9.1(H) - -  LYMPHSABS 0.7 - 4.0 K/uL 1.8 - -      There is no height or weight on file to calculate BMI.  Orders:  Orders Placed This Encounter  Procedures  . XR KNEE 3 VIEW RIGHT  . XR KNEE 3 VIEW LEFT   No orders of the defined types were placed in this encounter.    Procedures: Large Joint Inj: bilateral knee on 03/27/2021 5:10 PM Indications: pain and diagnostic evaluation Details: 22 G 1.5 in needle, anterolateral approach  Arthrogram: No  Outcome: tolerated well, no immediate complications Procedure, treatment alternatives, risks and benefits explained, specific risks discussed. Consent was given by the patient.     Clinical Data: No additional findings.  ROS:  All other systems negative, except as noted in the HPI. Review of Systems  Objective: Vital Signs: There were no vitals taken for this visit.  Specialty Comments:  No specialty comments available.  PMFS History: Patient Active Problem List   Diagnosis Date Noted  . Language barrier 12/09/2016  . Bacterial infection due to H. pylori 12/09/2016  . Abdominal tenderness, left lower quadrant 11/05/2016  . Gastroesophageal reflux disease 11/05/2016  . Hemorrhage of rectum and anus 11/05/2016  . Essential  hypertension 08/25/2016  . Benign prostatic hyperplasia with lower urinary tract symptoms 08/25/2016  . Chronic midline low back pain 08/25/2016   Past Medical History:  Diagnosis Date  . BPH (benign prostatic hyperplasia)   . GERD (gastroesophageal reflux disease)   . Glaucoma   . Hearing loss   . Hypertension   . Osteoarthritis    both knee  . Vitamin D deficiency     Family History  Problem Relation Age of Onset  . Diabetes Neg Hx     Past Surgical History:  Procedure Laterality Date  . CATARACT EXTRACTION    . COLONOSCOPY WITH PROPOFOL N/A 11/05/2016   Procedure: COLONOSCOPY WITH PROPOFOL;  Surgeon: Wilford Corner, MD;  Location: [facility];  Service: Endoscopy;  Laterality: N/A;  . ESOPHAGOGASTRODUODENOSCOPY (EGD) WITH  PROPOFOL N/A 11/05/2016   Procedure: ESOPHAGOGASTRODUODENOSCOPY (EGD) WITH PROPOFOL;  Surgeon: Wilford Corner, MD;  Location: [facility];  Service: Endoscopy;  Laterality: N/A;  . EYE SURGERY Bilateral 2007   Social History   Occupational History  . Not on file  Tobacco Use  . Smoking status: Never  . Smokeless tobacco: Never  Vaping Use  . Vaping Use: Never used  Substance and Sexual Activity  . Alcohol use: No  . Drug use: No  . Sexual activity: Never

## 2021-03-31 ENCOUNTER — Other Ambulatory Visit: Payer: Self-pay

## 2021-03-31 MED ORDER — PREDNISOLONE ACETATE 1 % OP SUSP
OPHTHALMIC | 1 refills | Status: DC
  Filled 2021-03-31: qty 5, 20d supply, fill #0
  Filled 2021-06-06: qty 5, 20d supply, fill #1

## 2021-03-31 MED ORDER — DORZOLAMIDE HCL-TIMOLOL MAL 2-0.5 % OP SOLN
OPHTHALMIC | 5 refills | Status: AC
  Filled 2021-03-31: qty 10, 80d supply, fill #0
  Filled 2021-06-06: qty 10, 80d supply, fill #1

## 2021-03-31 MED ORDER — LATANOPROST 0.005 % OP SOLN
OPHTHALMIC | 5 refills | Status: AC
  Filled 2021-03-31: qty 2.5, 40d supply, fill #0
  Filled 2021-06-06: qty 2.5, 40d supply, fill #1
  Filled 2021-07-14: qty 2.5, 40d supply, fill #2

## 2021-03-31 MED ORDER — BRIMONIDINE TARTRATE 0.2 % OP SOLN
OPHTHALMIC | 5 refills | Status: AC
  Filled 2021-03-31: qty 5, 40d supply, fill #0
  Filled 2021-06-06: qty 5, 40d supply, fill #1
  Filled 2021-07-14: qty 10, 80d supply, fill #2

## 2021-03-31 MED ORDER — ATROPINE SULFATE 1 % OP SOLN
OPHTHALMIC | 1 refills | Status: AC
  Filled 2021-03-31: qty 5, 80d supply, fill #0

## 2021-04-02 ENCOUNTER — Other Ambulatory Visit: Payer: Self-pay

## 2021-04-02 ENCOUNTER — Ambulatory Visit: Payer: Self-pay | Admitting: Vascular Surgery

## 2021-04-03 ENCOUNTER — Other Ambulatory Visit: Payer: Self-pay

## 2021-04-11 ENCOUNTER — Other Ambulatory Visit: Payer: Self-pay

## 2021-04-15 ENCOUNTER — Ambulatory Visit (INDEPENDENT_AMBULATORY_CARE_PROVIDER_SITE_OTHER): Payer: Medicaid Other | Admitting: Physician Assistant

## 2021-04-15 ENCOUNTER — Other Ambulatory Visit: Payer: Self-pay

## 2021-04-15 DIAGNOSIS — M25561 Pain in right knee: Secondary | ICD-10-CM | POA: Diagnosis not present

## 2021-04-15 DIAGNOSIS — G8929 Other chronic pain: Secondary | ICD-10-CM | POA: Diagnosis not present

## 2021-04-15 DIAGNOSIS — M25562 Pain in left knee: Secondary | ICD-10-CM | POA: Diagnosis not present

## 2021-04-15 NOTE — Progress Notes (Signed)
Office Visit Note   Patient: James Ayers           Date of Birth: [birth year]           MRN: [id number] Visit Date: 04/15/2021              Requested by: Gildardo Pounds, NP [place],  [place] [address] PCP: Gildardo Pounds, NP  No chief complaint on file.     HPI: Patient is accompanied by his son.  He is 3 weeks status postinjection bilateral knees.  Son says he is doing much better.  But he still does not walk much and is wondering if he can work with a physical therapist  Assessment & Plan: Visit Diagnoses:  1. Chronic pain of both knees     Plan: Patient will be referred to physical therapy follow-up as needed if new injections  Follow-Up Instructions: No follow-ups on file.   Ortho Exam  Patient is alert, oriented, no adenopathy, well-dressed, normal affect, normal respiratory effort. Bilateral knees: No effusion no swelling some crepitus with range of motion but range of motion is painless.  Imaging: No results found. No images are attached to the encounter.  Labs: Lab Results  Component Value Date   HGBA1C 5.7 08/25/2016   ESRSEDRATE 21 (H) 01/06/2008   LABURIC 7.5 08/25/2016   LABURIC 7.4 01/06/2008     Lab Results  Component Value Date   ALBUMIN 3.9 03/08/2021   ALBUMIN 3.3 (L) 12/12/2020   ALBUMIN 4.3 03/21/2018    No results found for: MG Lab Results  Component Value Date   VD25OH 28.5 (L) 03/21/2018   VD25OH 21 (L) 08/25/2016    No results found for: PREALBUMIN CBC EXTENDED Latest Ref Rng & Units 03/08/2021 12/12/2020 03/21/2018  WBC 4.0 - 10.5 K/uL 12.1(H) 7.0 9.0  RBC 4.22 - 5.81 MIL/uL 5.11 4.78 5.07  HGB 13.0 - 17.0 g/dL 15.7 14.6 15.3  HCT 39.0 - 52.0 % 46.6 45.1 45.8  PLT 150 - 400 K/uL 217 199 200  NEUTROABS 1.7 - 7.7 K/uL 9.1(H) - -  LYMPHSABS 0.7 - 4.0 K/uL 1.8 - -     There is no height or weight on file to calculate BMI.  Orders:  Orders Placed This Encounter  Procedures   Ambulatory referral to  Physical Therapy   No orders of the defined types were placed in this encounter.    Procedures: No procedures performed  Clinical Data: No additional findings.  ROS:  All other systems negative, except as noted in the HPI. Review of Systems  Objective: Vital Signs: There were no vitals taken for this visit.  Specialty Comments:  No specialty comments available.  PMFS History: Patient Active Problem List   Diagnosis Date Noted   Language barrier 12/09/2016   Bacterial infection due to H. pylori 12/09/2016   Abdominal tenderness, left lower quadrant 11/05/2016   Gastroesophageal reflux disease 11/05/2016   Hemorrhage of rectum and anus 11/05/2016   Essential hypertension 08/25/2016   Benign prostatic hyperplasia with lower urinary tract symptoms 08/25/2016   Chronic midline low back pain 08/25/2016   Past Medical History:  Diagnosis Date   BPH (benign prostatic hyperplasia)    GERD (gastroesophageal reflux disease)    Glaucoma    Hearing loss    Hypertension    Osteoarthritis    both knee   Vitamin D deficiency     Family History  Problem Relation Age of Onset   Diabetes Neg  Hx     Past Surgical History:  Procedure Laterality Date   CATARACT EXTRACTION     COLONOSCOPY WITH PROPOFOL N/A 11/05/2016   Procedure: COLONOSCOPY WITH PROPOFOL;  Surgeon: Wilford Corner, MD;  Location: [facility];  Service: Endoscopy;  Laterality: N/A;   ESOPHAGOGASTRODUODENOSCOPY (EGD) WITH PROPOFOL N/A 11/05/2016   Procedure: ESOPHAGOGASTRODUODENOSCOPY (EGD) WITH PROPOFOL;  Surgeon: Wilford Corner, MD;  Location: [facility];  Service: Endoscopy;  Laterality: N/A;   EYE SURGERY Bilateral 2007   Social History   Occupational History   Not on file  Tobacco Use   Smoking status: Never   Smokeless tobacco: Never  Vaping Use   Vaping Use: Never used  Substance and Sexual Activity   Alcohol use: No   Drug use: No   Sexual activity: Never

## 2021-04-23 ENCOUNTER — Other Ambulatory Visit: Payer: Self-pay | Admitting: Nurse Practitioner

## 2021-04-23 ENCOUNTER — Other Ambulatory Visit: Payer: Self-pay

## 2021-04-23 DIAGNOSIS — M170 Bilateral primary osteoarthritis of knee: Secondary | ICD-10-CM

## 2021-04-23 MED ORDER — DICLOFENAC SODIUM 1 % EX GEL
2.0000 g | Freq: Four times a day (QID) | CUTANEOUS | 0 refills | Status: AC | PRN
  Filled 2021-04-23: qty 100, 12d supply, fill #0

## 2021-04-23 NOTE — Telephone Encounter (Signed)
Medication Refill - Medication: Diclofenac Sodium  Has the patient contacted their pharmacy? Yes.   Pt states that he is out of this medication. He states that he is needing to have it due to pain in his knee. Pt has an upcoming appt on 05/05/21. Please advise.  (Agent: If no, request that the patient contact the pharmacy for the refill.) (Agent: If yes, when and what did the pharmacy advise?)  Preferred Pharmacy (with phone number or street name):  [address] [place] [address]  Phone: [phone] Fax: [phone]  Hours: M-F 8:30a-5:30p    Agent: Please be advised that RX refills may take up to 3 business days. We ask that you follow-up with your pharmacy.

## 2021-04-23 NOTE — Telephone Encounter (Signed)
Next OV 05/07/21. Recent referral to Orthopaedic for his knees. Approved per protocol. Requested Prescriptions  Pending Prescriptions Disp Refills  . diclofenac Sodium (VOLTAREN) 1 % GEL 100 g 0    Sig: Apply 2 g topically 4 (four) times daily as needed.     Analgesics:  Topicals Passed - 04/23/2021  1:48 PM      Passed - Valid encounter within last 12 months    Recent Outpatient Visits          1 month ago Chronic abdominal pain   [place], MD   1 month ago No-show for appointment   [place] Elsie Stain, MD   2 years ago Essential hypertension   [place], Zelda W, NP   3 years ago Essential hypertension   [place], Vernia Buff, NP   4 years ago HTN (hypertension), benign   [place], Leda Quail, MD      Future Appointments            In 2 weeks Gildardo Pounds, NP [place]

## 2021-04-29 ENCOUNTER — Ambulatory Visit (INDEPENDENT_AMBULATORY_CARE_PROVIDER_SITE_OTHER): Payer: Medicaid Other | Admitting: Gastroenterology

## 2021-04-29 ENCOUNTER — Ambulatory Visit: Payer: Medicaid Other | Admitting: Gastroenterology

## 2021-04-29 ENCOUNTER — Other Ambulatory Visit: Payer: Medicaid Other

## 2021-04-29 ENCOUNTER — Other Ambulatory Visit: Payer: Self-pay

## 2021-04-29 ENCOUNTER — Encounter: Payer: Self-pay | Admitting: Gastroenterology

## 2021-04-29 VITALS — BP 114/66 | HR 88 | Wt 169.0 lb

## 2021-04-29 DIAGNOSIS — R1084 Generalized abdominal pain: Secondary | ICD-10-CM | POA: Diagnosis not present

## 2021-04-29 DIAGNOSIS — K589 Irritable bowel syndrome without diarrhea: Secondary | ICD-10-CM

## 2021-04-29 DIAGNOSIS — R630 Anorexia: Secondary | ICD-10-CM

## 2021-04-29 MED ORDER — DICYCLOMINE HCL 10 MG PO CAPS
10.0000 mg | ORAL_CAPSULE | Freq: Three times a day (TID) | ORAL | 5 refills | Status: AC
  Filled 2021-04-29: qty 90, 30d supply, fill #0

## 2021-04-29 NOTE — Patient Instructions (Addendum)
If you are age 85 or older, your body mass index should be between 23-30. Your Body mass index is 21.7 kg/m. If this is out of the aforementioned range listed, please consider follow up with your Primary Care Provider. __________________________________________________________  The [facility] providers would like to encourage you to use [facility] to communicate with providers for non-urgent requests or questions.  Due to long hold times on the telephone, sending your provider a message by [facility] may be a faster and more efficient way to get a response.  Please allow 48 business hours for a response.  Please remember that this is for non-urgent requests.   Your provider has requested that you go to the basement level for lab work before leaving today. Press "B" on the elevator. The lab is located at the first door on the left as you exit the elevator.  Start dicyclomine 10 mg 1 tablet before meals 3 times daily.  Try to use a capful of Miralax in 8 ounces of liquid daily.  Follow up as needed.  Thank you for entrusting me with your care and choosing [facility].  Alonza Bogus, PA-C

## 2021-04-29 NOTE — Progress Notes (Signed)
04/29/2021 James Ayers [id number] [birth year]   HISTORY OF PRESENT ILLNESS:  This is an 87 year old male who is new to our practice.  Referred here by Dr. Wynetta Emery for chronic abdominal pain.  He comes with his son as well as an interpreter as he is extremely hard of hearing and primarily speaks Arabic.  Patient's daughter-in-law was also on the phone who is a physician and practiced overseas, but does not work here in the [place].  She seemed very knowledgeable and was very helpful.  Patient complains of abdominal pain, which he describes as colicky pain and sensation that he has to move his bowels.  Daughter-in-law believes he has a component of IBS.  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  He mostly started with his abdominal complaints since that time.  The symptoms have been present for years and he underwent EGD and colonoscopy in 2018 and he has had 2 CT scans that have been without any major pathology within the last few months.  Labs unremarkable.  They report that he is stubborn and does not want to listen to them.  He travels back to his country often as well.  He does have history of H. pylori and was treated in 2018 after his EGD and then apparently was treated again fairly recently, although they are unsure that he ever completed the medications, once again as he does not seem to listen to the son or daughter-in-law.    They say that he refuses to eat because it hurts his stomach and that he is not doing any exercise so he says that he does not need to eat.  He also complains that he does not sleep and is asking for something to help his sleep, but his son says that every time he goes in there that he is sleeping.  He also complains that he cannot sleep at night and his son says it is because he is sleeping all day.  They keep telling him he needs to get up and move around and walk with his  walker and all he wants to do is lay around in the bed   Past Medical History:  Diagnosis Date   BPH (benign prostatic hyperplasia)    GERD (gastroesophageal reflux disease)    Glaucoma    Hearing loss    Hypertension    Osteoarthritis    both knee   Vitamin D deficiency    Past Surgical History:  Procedure Laterality Date   CATARACT EXTRACTION     COLONOSCOPY WITH PROPOFOL N/A 11/05/2016   Procedure: COLONOSCOPY WITH PROPOFOL;  Surgeon: Wilford Corner, MD;  Location: [facility];  Service: Endoscopy;  Laterality: N/A;   ESOPHAGOGASTRODUODENOSCOPY (EGD) WITH PROPOFOL N/A 11/05/2016   Procedure: ESOPHAGOGASTRODUODENOSCOPY (EGD) WITH PROPOFOL;  Surgeon: Wilford Corner, MD;  Location: [facility];  Service: Endoscopy;  Laterality: N/A;   EYE SURGERY Bilateral 2007    reports that he has never smoked. He has never used smokeless tobacco. He reports that he does not drink alcohol and does not use drugs. family history includes Hypertension in his father. Allergies  Allergen Reactions   Penicillins Rash    Has patient had a PCN reaction causing immediate rash, facial/tongue/throat swelling, SOB or lightheadedness with hypotension: Yes Has patient had a PCN reaction causing severe rash involving mucus membranes or skin necrosis: No Has patient had a PCN reaction that  required hospitalization No Has patient had a PCN reaction occurring within the last 10 years: No If all of the above answers are "NO", then may proceed with Cephalosporin use.       Outpatient Encounter Medications as of 04/29/2021  Medication Sig   acetaminophen (TYLENOL) 325 MG tablet Take 650 mg by mouth every 6 (six) hours as needed for moderate pain or headache.   amLODipine (NORVASC) 5 MG tablet Take 1 tablet (5 mg total) by mouth daily.   aspirin EC 81 MG tablet Take 81 mg by mouth daily.   atropine 1 % ophthalmic solution Instill 1 drop every day into left eye   brimonidine (ALPHAGAN) 0.2 % ophthalmic  solution Instill 1 drop 2 times every day into right eye   diclofenac Sodium (VOLTAREN) 1 % GEL Apply 2 g topically 4 (four) times daily as needed.   dorzolamide-timolol (COSOPT) 22.3-6.8 MG/ML ophthalmic solution Instill 1 drop 2 times every day into right eye   doxazosin (CARDURA) 4 MG tablet Take 1 tablet (4 mg total) by mouth at bedtime.   finasteride (PROSCAR) 5 MG tablet Take 1 tablet (5 mg total) by mouth at bedtime.   latanoprost (XALATAN) 0.005 % ophthalmic solution Instill 1 drop every day into right eye in the evening   lisinopril (PRINIVIL,ZESTRIL) 10 MG tablet Take 1 tablet (10 mg total) by mouth daily.   loperamide (IMODIUM) 2 MG capsule Take 1 capsule (2 mg total) by mouth 4 (four) times daily as needed for diarrhea or loose stools.   omeprazole (PRILOSEC) 40 MG capsule Take 1 capsule (40 mg total) by mouth in the morning and at bedtime.   prednisoLONE acetate (PRED FORTE) 1 % ophthalmic suspension Instill 1 drop 4 times every day into left eye   Travoprost, BAK Free, (TRAVATAN) 0.004 % SOLN ophthalmic solution Place 1 drop into both eyes at bedtime.    [DISCONTINUED] brimonidine (ALPHAGAN) 0.2 % ophthalmic solution Place 1 drop into both eyes 2 (two) times daily.    [DISCONTINUED] dicyclomine (BENTYL) 20 MG tablet Take 1 tablet (20 mg total) by mouth 3 (three) times daily as needed for spasms. (Patient not taking: Reported on 04/29/2021)   [DISCONTINUED] dorzolamide (TRUSOPT) 2 % ophthalmic solution Place 1 drop into both eyes 2 (two) times daily.   [DISCONTINUED] prednisoLONE acetate (PRED FORTE) 1 % ophthalmic suspension Place 1 drop into the left eye 2 (two) times daily.    [DISCONTINUED] sucralfate (CARAFATE) 1 g tablet take 1 tab by four times daily   No facility-administered encounter medications on file as of 04/29/2021.     REVIEW OF SYSTEMS  : All other systems reviewed and negative except where noted in the History of Present Illness.   PHYSICAL EXAM: BP 114/66 (BP  Location: Left Arm, Patient Position: Sitting, Cuff Size: Normal)   Pulse 88   Wt 169 lb (76.7 kg)   BMI 21.70 kg/m  General: Well developed male in no acute distress Head: Normocephalic and atraumatic Eyes:  Sclerae anicteric, conjunctiva pink. Ears: Normal auditory acuity Lungs: Clear throughout to auscultation; no W/R/R. Heart: Regular rate and rhythm; no M/R/G. Abdomen: Soft, non-distended.  BS present.  Mild left sided TTP. Musculoskeletal: Symmetrical with no gross deformities  Skin: No lesions on visible extremities Extremities: No edema  Neurological: Alert oriented x 4, grossly non-focal Psychological:  Alert and cooperative. Normal mood and affect  ASSESSMENT AND PLAN: *87 year old male who is new to our practice.  He comes with his son as well as  an interpreter as he is extremely hard of hearing and primarily speaks Arabic.  Patient's daughter-in-law was also on the phone who is a physician and practiced overseas, but does not work here in the [place].  She seemed very knowledgeable and was very helpful.  Patient complains of abdominal pain, which he describes as colicky pain and sensation that he has to move his bowels.  Daughter-in-law believes he has a component of IBS.  The symptoms have been present for years and he underwent EGD and colonoscopy in 2018 and he has had 2 CT scans that have been without any major pathology within the last few months.  They report that he is stubborn and does not want to listen to them.  I am not sure if there is a component of dementia involved or not.  He travels back to his country often as well.  Very difficult to make much of his visit today.  He does have history of H. pylori and was treated in 2018 and then apparently was treated again fairly recently, although they are unsure that he ever completed the medications, once again as he does not seem to listen to the son or daughter-in-law.  I am going to check a stool for H. pylori and  certainly if it is positive we need to try to convince him to take another course of treatment.  I am also going to start him on dicyclomine 10 mg 3 times daily.  Prescription sent to pharmacy.  They will try to get him to take a dose of MiraLAX every day as they felt that this has helped him although he is reluctant to take it.  They will call us with an update and let us know if the dicyclomine seems to be helping, etc.  This was an extremely difficult visit.  **60 minutes was spent with the patient in which at least 50% was used in counseling and discussion of options.   CC:  Gildardo Pounds, NP CC:  Dr. Wynetta Emery

## 2021-04-30 ENCOUNTER — Other Ambulatory Visit: Payer: Medicaid Other

## 2021-04-30 DIAGNOSIS — K589 Irritable bowel syndrome without diarrhea: Secondary | ICD-10-CM

## 2021-04-30 DIAGNOSIS — R630 Anorexia: Secondary | ICD-10-CM

## 2021-04-30 DIAGNOSIS — R1084 Generalized abdominal pain: Secondary | ICD-10-CM

## 2021-04-30 NOTE — Progress Notes (Signed)
Thanks for helping him, sounds like it was a challenging visit.  I agree with the plan you described.

## 2021-05-02 LAB — H. PYLORI ANTIGEN, STOOL: H pylori Ag, Stl: NEGATIVE

## 2021-05-07 ENCOUNTER — Ambulatory Visit: Payer: Medicaid Other | Admitting: Nurse Practitioner

## 2021-05-09 DIAGNOSIS — H6123 Impacted cerumen, bilateral: Secondary | ICD-10-CM | POA: Insufficient documentation

## 2021-05-12 ENCOUNTER — Encounter: Payer: Self-pay | Admitting: Physical Therapy

## 2021-05-12 ENCOUNTER — Other Ambulatory Visit: Payer: Self-pay

## 2021-05-12 ENCOUNTER — Ambulatory Visit: Payer: Medicaid Other | Attending: Physician Assistant | Admitting: Physical Therapy

## 2021-05-12 ENCOUNTER — Ambulatory Visit: Payer: Medicaid Other | Admitting: Nurse Practitioner

## 2021-05-12 DIAGNOSIS — M25562 Pain in left knee: Secondary | ICD-10-CM | POA: Diagnosis not present

## 2021-05-12 DIAGNOSIS — R2689 Other abnormalities of gait and mobility: Secondary | ICD-10-CM | POA: Diagnosis present

## 2021-05-12 DIAGNOSIS — M25561 Pain in right knee: Secondary | ICD-10-CM | POA: Diagnosis present

## 2021-05-12 DIAGNOSIS — M6281 Muscle weakness (generalized): Secondary | ICD-10-CM | POA: Insufficient documentation

## 2021-05-12 DIAGNOSIS — G8929 Other chronic pain: Secondary | ICD-10-CM | POA: Insufficient documentation

## 2021-05-12 NOTE — Therapy (Signed)
[facility] [place], [place], [address] Phone: [phone]   Fax:  [phone]  Physical Therapy Evaluation  Patient Details  Name: James Ayers MRN: [id number] Date of Birth: Jul 13, 1934 Referring Provider (PT): Persons, Bevely Palmer, [place]   Encounter Date: 05/12/2021   PT End of Session - 05/12/21 1541     Visit Number 1    Number of Visits 16    Date for PT Re-Evaluation 07/07/21    Authorization Type [facility] [employer]    PT Start Time 1415    PT Stop Time 1445    PT Time Calculation (min) 30 min    Equipment Utilized During Treatment Gait belt    Activity Tolerance Patient tolerated treatment well    Behavior During Therapy WFL for tasks assessed/performed             Past Medical History:  Diagnosis Date   BPH (benign prostatic hyperplasia)    GERD (gastroesophageal reflux disease)    Glaucoma    Hearing loss    Hypertension    Osteoarthritis    both knee   Vitamin D deficiency     Past Surgical History:  Procedure Laterality Date   CATARACT EXTRACTION     COLONOSCOPY WITH PROPOFOL N/A 11/05/2016   Procedure: COLONOSCOPY WITH PROPOFOL;  Surgeon: Wilford Corner, MD;  Location: [facility];  Service: Endoscopy;  Laterality: N/A;   ESOPHAGOGASTRODUODENOSCOPY (EGD) WITH PROPOFOL N/A 11/05/2016   Procedure: ESOPHAGOGASTRODUODENOSCOPY (EGD) WITH PROPOFOL;  Surgeon: Wilford Corner, MD;  Location: [facility];  Service: Endoscopy;  Laterality: N/A;   EYE SURGERY Bilateral 2007    There were no vitals filed for this visit.    Subjective Assessment - 05/12/21 1412     Subjective Patient reports knees bilateral knee pain. Started worsening three months ago. States that he has been having the knee pain for a couple years, but over the the past three months he has also been having much more trouble walking and getting around due to other medical issues. His decrease in activity has also increased his  bilateral knee pain, when then makes it harder to stand and walk. Patient reports he has always uses the straight cane for ambulation, but was able to get around his house without any issue. Currently patient has much diffculty walking between room and frequently requires assist to stand and walk due to weakness and balance.    Patient is accompained by: Interpreter   patient's son acts as interpreter   Limitations Standing;Walking;House hold activities    How long can you stand comfortably? 1-2 minutes    How long can you walk comfortably? < 1 minutes    Patient Stated Goals Walk better    Currently in Pain? Yes    Pain Score 5    pain is 5-10/10, will get worse when standing or walking   Pain Location Knee    Pain Orientation Right;Left    Pain Descriptors / Indicators Aching;Sore    Pain Type Chronic pain    Pain Onset More than a month ago    Pain Frequency Intermittent    Aggravating Factors  Walking, standing, sitting extended periods then trying to stand    Pain Relieving Factors Medication, cream, rest    Effect of Pain on Daily Activities Patient progressively getting more limited with household mobility                [facility] PT Assessment - 05/12/21 0001  Assessment   Medical Diagnosis Chronic pain of both knees    Referring Provider (PT) Persons, Bevely Palmer, [place]    Onset Date/Surgical Date --   ongoing knee pain for years, worsened over past 3 months with difficulty walking   Prior Therapy No      Precautions   Precautions Fall    Precaution Comments impaired gait using assistive device      Restrictions   Weight Bearing Restrictions No      Balance Screen   Has the patient fallen in the past 6 months No    Has the patient had a decrease in activity level because of a fear of falling?  No    Is the patient reluctant to leave their home because of a fear of falling?  No      Prior Function   Level of Independence Independent with basic ADLs;Independent with  household mobility with device;Needs assistance with homemaking    Vocation Retired    Leisure None reported      Cognition   Overall Cognitive Status Within Functional Limits for tasks assessed      Observation/Other Assessments   Observations Patient appears in no apparent distress, he is accompanied by his son who assists with sit<>stand, tranfers,    Focus on Therapeutic Outcomes (FOTO)  NA - [facility]      Sensation   Light Touch Appears Intact      Coordination   Gross Motor Movements are Fluid and Coordinated Yes      Posture/Postural Control   Posture Comments Rounded shoulder and forward head posture, generally slouched seated posture      ROM / Strength   AROM / PROM / Strength AROM;Strength      AROM   AROM Assessment Site Knee    Right/Left Knee Right;Left    Right Knee Extension -5    Right Knee Flexion 130    Left Knee Extension -5    Left Knee Flexion 130      Strength   Strength Assessment Site Knee;Hip    Right/Left Hip Right;Left    Right Hip Flexion 4-/5    Right Hip Extension 3-/5    Right Hip ABduction 3-/5    Left Hip Flexion 4-/5    Left Hip Extension 3-/5    Left Hip ABduction 3-/5    Right/Left Knee Right;Left    Right Knee Flexion 4/5    Right Knee Extension 4/5    Left Knee Flexion 4/5    Left Knee Extension 4/5      Flexibility   Soft Tissue Assessment /Muscle Length yes    Hamstrings Limited bilaterally      Palpation   Patella mobility Not assessed    Palpation comment TTP generalized to bilateral joint lines      Transfers   Comments Patient required hand hold assist for all sit<>stand and stand-pivot transfers      Ambulation/Gait   Ambulation/Gait Yes    Ambulation/Gait Assistance 4: Min assist    Ambulation/Gait Assistance Details Patient reequired hand hold assist for balance and stability with standing and walking    Assistive device Straight cane    Gait Comments Decreased gait speed, decreased step length bilaterally                         Objective measurements completed on examination: See above findings.       [facility] Adult PT Treatment/Exercise - 05/12/21 0001  Exercises   Exercises Knee/Hip            [facility] Adult PT Treatment/Exercise:  Therapeutic Exercise: - SLR x 10 each - Bridge x 5 - Sidelying hip abduction x 10 each - LAQ x 20 each - Sit<>stand x 5 with handhold manual assist  Manual Therapy: - NA  Neuromuscular re-ed: - NA  Therapeutic Activity: - NA  Modalities: - NA          PT Education - 05/12/21 1540     Education Details Exam findings, POC, HEP, importance of movement/walking during the day    Person(s) Educated Patient;Child(ren)    Methods Explanation;Demonstration;Tactile cues;Verbal cues;Handout    Comprehension Verbalized understanding;Returned demonstration;Verbal cues required;Tactile cues required;Need further instruction              PT Short Term Goals - 05/12/21 1554       PT SHORT TERM GOAL #1   Title Patient will be I with initial HEP to progress with PT    Baseline HEP provided at eval    Time 4    Period Weeks    Status New    Target Date 06/09/21      PT SHORT TERM GOAL #2   Title Patient will be able to perform sit<>stand from standard chair without manual assist to improve independence with transfers    Baseline patient requires min-A handhold assist for standing from a chair    Time 4    Period Weeks    Status New    Target Date 06/09/21      PT SHORT TERM GOAL #3   Title Patient will be able to ambulate 20 ft without assist using SPC in order to improve independence with household mobility    Baseline patient requires min-A handhold assist in order to ambulate 10 ft    Time 4    Period Weeks    Status New    Target Date 06/09/21               PT Long Term Goals - 05/12/21 1559       PT LONG TERM GOAL #1   Title Patient will be I with final HEP in order to maintain progress from PT     Baseline HEP provided at baseline    Time 8    Period Weeks    Status New    Target Date 07/07/21      PT LONG TERM GOAL #2   Title Patient will report </= 5/10 pain with all activity in order to reduce functional limitations at home    Baseline patient reports pain 5/10 at rest and between 6-10/10 with activity    Time 8    Period Weeks    Status New    Target Date 07/07/21      PT LONG TERM GOAL #3   Title Patient will demonstrate bilateral knee strength grossly 5/5 MMT in order to improve standing and walking tolerance    Baseline bilateral knee pain grossly 4/5 MMT    Time 8    Period Weeks    Status New    Target Date 07/07/21      PT LONG TERM GOAL #4   Title Patient will report no limitation with household mobility using SPC in order to indicate independence at home    Baseline patient requires manual assst for ambulation at eval    Time 8    Period Weeks    Status New  Target Date 07/07/21      PT LONG TERM GOAL #5   Title Establish goal for balance measure (5xSTS or TUG)                    Plan - 05/12/21 1547     Clinical Impression Statement Patient presents to PT with report of chronic bilateral knee pain that has been worsening over the past few months due to other medical problems resulting in decreased mobility at home. Evaluation was limited this visit due to patient arriving late and language barrier. Currently patient requires assist for transfers and walking due to knee knee pain, weakness, and balance impairment. He uses a [facility] for all mobility at baseline. He demonstrates bilateral LE weakness and hamstring tightness likely leading to impairment knee extension with gait and postural deviations while seated. Patient was provided exercises to initiate supine and seated strengthening, and he would benefit from continued skilled PT to progress his strength and walking ability in order to improve independence and maximize functional ability with  household mobility.    Personal Factors and Comorbidities Age;Fitness;Past/Current Experience;Time since onset of injury/illness/exacerbation    Examination-Activity Limitations Locomotion Level;Transfers;Stand;Stairs;Dressing;Squat;Sit    Examination-Participation Restrictions Meal Prep;Cleaning;Occupation;Community Activity;Shop;Laundry;Yard Work    [employer] Evolving/Moderate complexity    Clinical Decision Making Moderate    Rehab Potential Fair    PT Frequency 2x / week    PT Duration 8 weeks    PT Treatment/Interventions ADLs/Self Care Home Management;Aquatic Therapy;Cryotherapy;Electrical Stimulation;Iontophoresis 4mg /ml Dexamethasone;Moist Heat;Traction;Ultrasound;Gait training;Stair training;Functional mobility training;Therapeutic activities;Therapeutic exercise;Balance training;Neuromuscular re-education;Manual techniques;Patient/family education;Dry needling;Passive range of motion;Taping;Vasopneumatic Device;Spinal Manipulations;Joint Manipulations    PT Next Visit Plan Review HEP and progress PRN, NuStep, continue BLE strengthening with progression to standing as able, balance and gait training in // bars or LRAD    PT Home Exercise Plan [email or web address]    Consulted and Agree with Plan of Care Patient             Patient will benefit from skilled therapeutic intervention in order to improve the following deficits and impairments:  Abnormal gait, Decreased range of motion, Difficulty walking, Pain, Decreased activity tolerance, Postural dysfunction, Decreased strength, Decreased balance, Impaired flexibility  Visit Diagnosis: Chronic pain of left knee  Chronic pain of right knee  Muscle weakness (generalized)  Other abnormalities of gait and mobility     Problem List Patient Active Problem List   Diagnosis Date Noted   Poor appetite 04/29/2021   Generalized abdominal pain 04/29/2021   Irritable bowel syndrome 04/29/2021   Language barrier  12/09/2016   Bacterial infection due to H. pylori 12/09/2016   Abdominal tenderness, left lower quadrant 11/05/2016   Gastroesophageal reflux disease 11/05/2016   Hemorrhage of rectum and anus 11/05/2016   Essential hypertension 08/25/2016   Benign prostatic hyperplasia with lower urinary tract symptoms 08/25/2016   Chronic midline low back pain 08/25/2016    Hilda Blades, PT, DPT, LAT, ATC 05/12/21  4:07 PM Phone: [phone] Fax: [facility] [place], [place], [address] Phone: [phone]   Fax:  [phone]  Name: James Ayers MRN: [id number] Date of Birth: Dec 27, 1933   [facility] Authorization   Choose one: Rehabilitative  Standardized Assessment or Functional Outcome Tool: See Pain Assessment  Score or Percent Disability: 75%  Body Parts Treated (Select each separately):  Knee. Overall deficits/functional limitations for body part selected: severe N/A. Overall deficits/functional limitations for body part selected: N/A N/A. Overall deficits/functional limitations  for body part selected: N/A

## 2021-05-12 NOTE — Patient Instructions (Signed)
Access Code: [email or web address] URL: [email or web address] Date: 05/12/2021 Prepared by: Hilda Blades  Exercises Active Straight Leg Raise with Quad Set - 3 x daily - 7 x weekly - 20 reps Supine Bridge - 3 x daily - 7 x weekly - 10 reps - 3 hold Sidelying Hip Abduction - 3 x daily - 7 x weekly - 10 reps Seated Long Arc Quad - 3 x daily - 7 x weekly - 20 reps Sit to Stand with Armchair - 3 x daily - 7 x weekly - 5 reps

## 2021-05-21 ENCOUNTER — Ambulatory Visit: Payer: Medicaid Other | Admitting: Physical Therapy

## 2021-05-22 ENCOUNTER — Telehealth: Payer: Self-pay | Admitting: Physical Therapy

## 2021-05-22 NOTE — Telephone Encounter (Signed)
Attempted to contact patient using video interpreting service. Left VM informing patient of missed PT appointment, advised patient of attendance policy, and reminded patient of next scheduled appointment.  Hilda Blades, PT, DPT, LAT, ATC 05/22/21  8:20 AM Phone: [phone] Fax: [phone]

## 2021-05-27 ENCOUNTER — Other Ambulatory Visit: Payer: Self-pay

## 2021-05-27 ENCOUNTER — Ambulatory Visit: Payer: Medicaid Other

## 2021-05-27 DIAGNOSIS — M25562 Pain in left knee: Secondary | ICD-10-CM

## 2021-05-27 DIAGNOSIS — M6281 Muscle weakness (generalized): Secondary | ICD-10-CM

## 2021-05-27 DIAGNOSIS — M25561 Pain in right knee: Secondary | ICD-10-CM

## 2021-05-27 DIAGNOSIS — G8929 Other chronic pain: Secondary | ICD-10-CM

## 2021-05-27 DIAGNOSIS — R2689 Other abnormalities of gait and mobility: Secondary | ICD-10-CM

## 2021-05-27 NOTE — Therapy (Signed)
[place], [place], [address] Phone: [phone]   Fax:  [phone]  Physical Therapy Treatment  Patient Details  Name: James Ayers MRN: [id number] Date of Birth: 09/07/1933 Referring Provider (PT): Persons, Bevely Palmer, [place]   Encounter Date: 05/27/2021   PT End of Session - 05/27/21 1807     Visit Number 2    Number of Visits 16    Date for PT Re-Evaluation 07/07/21    Authorization Type [employer]    PT Start Time 1800    PT Stop Time 1830    PT Time Calculation (min) 30 min    Equipment Utilized During Treatment Gait belt    Activity Tolerance Patient tolerated treatment well    Behavior During Therapy WFL for tasks assessed/performed             Past Medical History:  Diagnosis Date   BPH (benign prostatic hyperplasia)    GERD (gastroesophageal reflux disease)    Glaucoma    Hearing loss    Hypertension    Osteoarthritis    both knee   Vitamin D deficiency     Past Surgical History:  Procedure Laterality Date   CATARACT EXTRACTION     COLONOSCOPY WITH PROPOFOL N/A 11/05/2016   Procedure: COLONOSCOPY WITH PROPOFOL;  Surgeon: Wilford Corner, MD;  Location: [facility];  Service: Endoscopy;  Laterality: N/A;   ESOPHAGOGASTRODUODENOSCOPY (EGD) WITH PROPOFOL N/A 11/05/2016   Procedure: ESOPHAGOGASTRODUODENOSCOPY (EGD) WITH PROPOFOL;  Surgeon: Wilford Corner, MD;  Location: [facility];  Service: Endoscopy;  Laterality: N/A;   EYE SURGERY Bilateral 2007    There were no vitals filed for this visit.   Subjective Assessment - 05/27/21 1805     Subjective Pt presents to PT with no current reports of pain or discomfort. Has been fairly compliant with HEP per report. Ready to begin PT at this time.    Patient is accompained by: Interpreter   daughter helps with interpreting   Currently in Pain? No/denies    Pain Score 0-No pain           [facility] Adult PT Treatment/Exercise:    Therapeutic Exercise: - LAQ x 20 2lbs - Seated march x 20 2lbs  Past Interventions Not Perfromed Today:  - SLR x 10 each - Bridge x 5 - Sidelying hip abduction x 10 each - Sit<>stand x 5 with handhold manual assist   Manual Therapy: - NA   Neuromuscular re-ed: - NA   Therapeutic Activity: - Education on log roll for decreasing back pain and functional mobility - stand pivot transfer education for pt and daughter   Modalities: - NA                               PT Short Term Goals - 05/12/21 1554       PT SHORT TERM GOAL #1   Title Patient will be I with initial HEP to progress with PT    Baseline HEP provided at eval    Time 4    Period Weeks    Status New    Target Date 06/09/21      PT SHORT TERM GOAL #2   Title Patient will be able to perform sit<>stand from standard chair without manual assist to improve independence with transfers    Baseline patient requires min-A handhold assist for standing from a chair    Time 4  Period Weeks    Status New    Target Date 06/09/21      PT SHORT TERM GOAL #3   Title Patient will be able to ambulate 20 ft without assist using SPC in order to improve independence with household mobility    Baseline patient requires min-A handhold assist in order to ambulate 10 ft    Time 4    Period Weeks    Status New    Target Date 06/09/21               PT Long Term Goals - 05/12/21 1559       PT LONG TERM GOAL #1   Title Patient will be I with final HEP in order to maintain progress from PT    Baseline HEP provided at baseline    Time 8    Period Weeks    Status New    Target Date 07/07/21      PT LONG TERM GOAL #2   Title Patient will report </= 5/10 pain with all activity in order to reduce functional limitations at home    Baseline patient reports pain 5/10 at rest and between 6-10/10 with activity    Time 8    Period Weeks    Status New    Target Date 07/07/21      PT LONG TERM  GOAL #3   Title Patient will demonstrate bilateral knee strength grossly 5/5 MMT in order to improve standing and walking tolerance    Baseline bilateral knee pain grossly 4/5 MMT    Time 8    Period Weeks    Status New    Target Date 07/07/21      PT LONG TERM GOAL #4   Title Patient will report no limitation with household mobility using SPC in order to indicate independence at home    Baseline patient requires manual assst for ambulation at eval    Time 8    Period Weeks    Status New    Target Date 07/07/21      PT LONG TERM GOAL #5   Title Establish goal for balance measure (5xSTS or TUG)                   Plan - 05/28/21 0902     Clinical Impression Statement Pt tolerated treatment fair, but reported continued knee and lower back pain at end of session. Today we focused on improivng LE strength and improving transfers for safety and comfort. He continues to demo considerable generalized muscle weakness, with bilateral HHA with ModA for sit to stand transfer. PT will continue to progress exercises as able per POC as prescribed.    PT Treatment/Interventions ADLs/Self Care Home Management;Aquatic Therapy;Cryotherapy;Electrical Stimulation;Iontophoresis 4mg /ml Dexamethasone;Moist Heat;Traction;Ultrasound;Gait training;Stair training;Functional mobility training;Therapeutic activities;Therapeutic exercise;Balance training;Neuromuscular re-education;Manual techniques;Patient/family education;Dry needling;Passive range of motion;Taping;Vasopneumatic Device;Spinal Manipulations;Joint Manipulations    PT Next Visit Plan Review HEP and progress PRN, NuStep, continue BLE strengthening with progression to standing as able, balance and gait training in // bars or [place]             Patient will benefit from skilled therapeutic intervention in order to improve the following deficits and impairments:  Abnormal gait, Decreased range of motion,  Difficulty walking, Pain, Decreased activity tolerance, Postural dysfunction, Decreased strength, Decreased balance, Impaired flexibility  Visit Diagnosis: Chronic pain of left knee  Chronic pain of right knee  Muscle weakness (generalized)  Other abnormalities of gait and mobility     Problem List Patient Active Problem List   Diagnosis Date Noted   Poor appetite 04/29/2021   Generalized abdominal pain 04/29/2021   Irritable bowel syndrome 04/29/2021   Language barrier 12/09/2016   Bacterial infection due to H. pylori 12/09/2016   Abdominal tenderness, left lower quadrant 11/05/2016   Gastroesophageal reflux disease 11/05/2016   Hemorrhage of rectum and anus 11/05/2016   Essential hypertension 08/25/2016   Benign prostatic hyperplasia with lower urinary tract symptoms 08/25/2016   Chronic midline low back pain 08/25/2016    Ward Chatters, PT 05/28/2021, 9:05 AM  [facility] [address] [place], [place], [address] Phone: [phone]   Fax:  [phone]  Name: James Ayers MRN: [id number] Date of Birth: Aug 09, 1934

## 2021-05-29 ENCOUNTER — Ambulatory Visit: Payer: Medicaid Other

## 2021-05-29 ENCOUNTER — Other Ambulatory Visit: Payer: Self-pay

## 2021-05-29 DIAGNOSIS — M6281 Muscle weakness (generalized): Secondary | ICD-10-CM

## 2021-05-29 DIAGNOSIS — M25562 Pain in left knee: Secondary | ICD-10-CM | POA: Diagnosis not present

## 2021-05-29 DIAGNOSIS — R2689 Other abnormalities of gait and mobility: Secondary | ICD-10-CM

## 2021-05-29 DIAGNOSIS — G8929 Other chronic pain: Secondary | ICD-10-CM

## 2021-05-30 NOTE — Therapy (Signed)
[place], [place], [address] Phone: [phone]   Fax:  [phone]  Physical Therapy Treatment  Patient Details  Name: James Ayers MRN: [id number] Date of Birth: 1934-01-03 Referring Provider (PT): Persons, Bevely Palmer, [place]   Encounter Date: 05/29/2021   PT End of Session - 05/29/21 1751     Visit Number 3    Number of Visits 16    Date for PT Re-Evaluation 07/07/21    Authorization Type [employer]    PT Start Time 1745    PT Stop Time 1823    PT Time Calculation (min) 38 min    Equipment Utilized During Treatment Gait belt    Activity Tolerance Patient tolerated treatment well    Behavior During Therapy WFL for tasks assessed/performed             Past Medical History:  Diagnosis Date   BPH (benign prostatic hyperplasia)    GERD (gastroesophageal reflux disease)    Glaucoma    Hearing loss    Hypertension    Osteoarthritis    both knee   Vitamin D deficiency     Past Surgical History:  Procedure Laterality Date   CATARACT EXTRACTION     COLONOSCOPY WITH PROPOFOL N/A 11/05/2016   Procedure: COLONOSCOPY WITH PROPOFOL;  Surgeon: Wilford Corner, MD;  Location: [facility];  Service: Endoscopy;  Laterality: N/A;   ESOPHAGOGASTRODUODENOSCOPY (EGD) WITH PROPOFOL N/A 11/05/2016   Procedure: ESOPHAGOGASTRODUODENOSCOPY (EGD) WITH PROPOFOL;  Surgeon: Wilford Corner, MD;  Location: [facility];  Service: Endoscopy;  Laterality: N/A;   EYE SURGERY Bilateral 2007    There were no vitals filed for this visit.   Subjective Assessment - 05/30/21 1025     Subjective Pt presents to PT with reports of severe lower back pain. His family continues to interpret, but pt has a hard time understanding questions/cues. He has not been compliant with HEP per family report.    Patient is accompained by: Interpreter   granddaughter interpreted for pt   Currently in Pain? Other (Comment)   unable to fully  assess          [facility] Adult PT Treatment/Exercise:   Therapeutic Exercise: - LAQ x 20 2lbs - Seated march x 20 2lbs - Seated ball squeeze 2x10 - 5 sec hold - Seated clamshell 2x15 red tband - Seated row 2x10 red tband - Sit<>stand x 3 with handhold manual assist   Past Interventions Not Perfromed Today:  - SLR x 10 each - Bridge x 5 - Sidelying hip abduction x 10 each   Manual Therapy: - IASTM via percussion device to L sided lumbar paraspinals in sitting   Neuromuscular re-ed: - NA   Therapeutic Activity: - Education on log roll for decreasing back pain and functional mobility - stand pivot transfer education for pt and daughter   Modalities: - NA                               PT Short Term Goals - 05/12/21 1554       PT SHORT TERM GOAL #1   Title Patient will be I with initial HEP to progress with PT    Baseline HEP provided at eval    Time 4    Period Weeks    Status New    Target Date 06/09/21      PT SHORT TERM GOAL #2   Title Patient  will be able to perform sit<>stand from standard chair without manual assist to improve independence with transfers    Baseline patient requires min-A handhold assist for standing from a chair    Time 4    Period Weeks    Status New    Target Date 06/09/21      PT SHORT TERM GOAL #3   Title Patient will be able to ambulate 20 ft without assist using SPC in order to improve independence with household mobility    Baseline patient requires min-A handhold assist in order to ambulate 10 ft    Time 4    Period Weeks    Status New    Target Date 06/09/21               PT Long Term Goals - 05/12/21 1559       PT LONG TERM GOAL #1   Title Patient will be I with final HEP in order to maintain progress from PT    Baseline HEP provided at baseline    Time 8    Period Weeks    Status New    Target Date 07/07/21      PT LONG TERM GOAL #2   Title Patient will report </= 5/10 pain with all  activity in order to reduce functional limitations at home    Baseline patient reports pain 5/10 at rest and between 6-10/10 with activity    Time 8    Period Weeks    Status New    Target Date 07/07/21      PT LONG TERM GOAL #3   Title Patient will demonstrate bilateral knee strength grossly 5/5 MMT in order to improve standing and walking tolerance    Baseline bilateral knee pain grossly 4/5 MMT    Time 8    Period Weeks    Status New    Target Date 07/07/21      PT LONG TERM GOAL #4   Title Patient will report no limitation with household mobility using [place] in order to indicate independence at home    Baseline patient requires manual assst for ambulation at eval    Time 8    Period Weeks    Status New    Target Date 07/07/21      PT LONG TERM GOAL #5   Title Establish goal for balance measure (5xSTS or TUG)                   Plan - 05/30/21 1026     Clinical Impression Statement Pt tolerated treatment fair, but it is difficult to progress exercises d/t language and visual impairments. He continues to demonstrate significant LE weakness that limits his safe transfer ability and ambulation. He responded well to IASTM to L sided lumbar paraspinals and seemed to note improvement in symptoms after this. He also seemed to respond well to row exercise, noting decreased back pain after, this was also added to HEP. Will continue to progress as able.    PT Treatment/Interventions ADLs/Self Care Home Management;Aquatic Therapy;Cryotherapy;Electrical Stimulation;Iontophoresis 4mg /ml Dexamethasone;Moist Heat;Traction;Ultrasound;Gait training;Stair training;Functional mobility training;Therapeutic activities;Therapeutic exercise;Balance training;Neuromuscular re-education;Manual techniques;Patient/family education;Dry needling;Passive range of motion;Taping;Vasopneumatic Device;Spinal Manipulations;Joint Manipulations    PT Next Visit Plan Review HEP and progress PRN, NuStep, continue  BLE strengthening with progression to standing as able, balance and gait training in // bars or [place]             Patient  will benefit from skilled therapeutic intervention in order to improve the following deficits and impairments:  Abnormal gait, Decreased range of motion, Difficulty walking, Pain, Decreased activity tolerance, Postural dysfunction, Decreased strength, Decreased balance, Impaired flexibility  Visit Diagnosis: Chronic pain of left knee  Chronic pain of right knee  Muscle weakness (generalized)  Other abnormalities of gait and mobility     Problem List Patient Active Problem List   Diagnosis Date Noted   Poor appetite 04/29/2021   Generalized abdominal pain 04/29/2021   Irritable bowel syndrome 04/29/2021   Language barrier 12/09/2016   Bacterial infection due to H. pylori 12/09/2016   Abdominal tenderness, left lower quadrant 11/05/2016   Gastroesophageal reflux disease 11/05/2016   Hemorrhage of rectum and anus 11/05/2016   Essential hypertension 08/25/2016   Benign prostatic hyperplasia with lower urinary tract symptoms 08/25/2016   Chronic midline low back pain 08/25/2016    Ward Chatters, PT 05/30/2021, 10:28 AM  [facility] [address] [place], [place], [address] Phone: [phone]   Fax:  [phone]  Name: James Ayers MRN: [id number] Date of Birth: 02/02/1934

## 2021-06-02 ENCOUNTER — Ambulatory Visit: Payer: Medicaid Other | Admitting: Physical Therapy

## 2021-06-04 ENCOUNTER — Ambulatory Visit: Payer: Medicaid Other | Admitting: Physical Therapy

## 2021-06-04 ENCOUNTER — Other Ambulatory Visit: Payer: Self-pay

## 2021-06-04 ENCOUNTER — Encounter: Payer: Self-pay | Admitting: Physical Therapy

## 2021-06-04 DIAGNOSIS — M25562 Pain in left knee: Secondary | ICD-10-CM | POA: Diagnosis not present

## 2021-06-04 NOTE — Therapy (Addendum)
[place], [place], [address] Phone: [phone]   Fax:  [phone]  Physical Therapy Treatment / Discharge  Patient Details  Name: James Ayers MRN: [id number] Date of Birth: 11-12-1933 Referring Provider (PT): Persons, Bevely Palmer, [place]   Encounter Date: 06/04/2021   PT End of Session - 06/04/21 1712     Visit Number 4    Number of Visits 16    Date for PT Re-Evaluation 07/07/21    Authorization Type [employer]    PT Start Time 1622    PT Stop Time 1700    PT Time Calculation (min) 38 min    Equipment Utilized During Treatment Gait belt    Activity Tolerance Patient tolerated treatment well    Behavior During Therapy WFL for tasks assessed/performed             Past Medical History:  Diagnosis Date   BPH (benign prostatic hyperplasia)    GERD (gastroesophageal reflux disease)    Glaucoma    Hearing loss    Hypertension    Osteoarthritis    both knee   Vitamin D deficiency     Past Surgical History:  Procedure Laterality Date   CATARACT EXTRACTION     COLONOSCOPY WITH PROPOFOL N/A 11/05/2016   Procedure: COLONOSCOPY WITH PROPOFOL;  Surgeon: Wilford Corner, MD;  Location: [facility];  Service: Endoscopy;  Laterality: N/A;   ESOPHAGOGASTRODUODENOSCOPY (EGD) WITH PROPOFOL N/A 11/05/2016   Procedure: ESOPHAGOGASTRODUODENOSCOPY (EGD) WITH PROPOFOL;  Surgeon: Wilford Corner, MD;  Location: [facility];  Service: Endoscopy;  Laterality: N/A;   EYE SURGERY Bilateral 2007    There were no vitals filed for this visit.   Subjective Assessment - 06/04/21 1708     Subjective Patient reports continued L > R knee pain this visit. He does not do much exercise or walking at home per son's report.    Patient is accompained by: Family member   son acts as interpreter, patient has difficulty hearing so he cannot hear the video interpreter   Patient Stated Goals Walk better    Currently in Pain?  Yes    Pain Score 5     Pain Location Knee    Pain Orientation Right;Left    Pain Descriptors / Indicators Aching;Sore    Pain Type Chronic pain    Pain Onset More than a month ago    Pain Frequency Intermittent    Aggravating Factors  Walking, standing, sitting extended periods then trying to stand                [facility] PT Assessment - 06/04/21 0001       Ambulation/Gait   Ambulation/Gait Yes    Ambulation/Gait Assistance 4: Min assist    Ambulation Distance (Feet) 100 Feet    Gait Comments Patient ambulated using walking stick in RUE and min-A hand hold assist for LUE                 [facility] Adult PT Treatment/Exercise:  Therapeutic Exercise: NuStep L3 x 5 min with LE only while taking subjective - required occasional manual assist for patient to keep track LAQ with 3# x 20 each Seated alternating march with 3# x 20 Supine hamstring stretch PROM 2 x 20 sec each Supine SKTC stretch PROM 2 x 20 sec each SLR x 10 each Sidelying clamshell x 20 each Seated calf stretch with strap 2 x 20 sec each Ambulating x 100 ft with walking stick and  min A hand hold assist  Manual Therapy: N/A  Neuromuscular re-ed: N/A  Therapeutic Activity: N/A  Modalities: N/A  Self Care: N/A                    PT Education - 06/04/21 1710     Education Details HEP consistency, walking with walker at home, patient's son reports they have two walkers    Person(s) Educated Patient    Methods Explanation;Demonstration;Verbal cues    Comprehension Verbalized understanding;Returned demonstration;Verbal cues required;Need further instruction              PT Short Term Goals - 06/04/21 1719       PT SHORT TERM GOAL #1   Title Patient will be I with initial HEP to progress with PT    Baseline continuing to progress, patient inconsistent with exercises    Time 4    Period Weeks    Status On-going    Target Date 06/09/21      PT SHORT TERM GOAL #2   Title  Patient will be able to perform sit<>stand from standard chair without manual assist to improve independence with transfers    Baseline patient continues to require min-A handhold assist for standing from a chair    Time 4    Period Weeks    Status On-going    Target Date 06/09/21      PT SHORT TERM GOAL #3   Title Patient will be able to ambulate 20 ft without assist using SPC in order to improve independence with household mobility    Baseline patient requires walking stick and min-A handhold assist in order to ambulate, able to ambulate 100 ft this visit    Time 4    Period Weeks    Status On-going    Target Date 06/09/21               PT Long Term Goals - 05/12/21 1559       PT LONG TERM GOAL #1   Title Patient will be I with final HEP in order to maintain progress from PT    Baseline HEP provided at baseline    Time 8    Period Weeks    Status New    Target Date 07/07/21      PT LONG TERM GOAL #2   Title Patient will report </= 5/10 pain with all activity in order to reduce functional limitations at home    Baseline patient reports pain 5/10 at rest and between 6-10/10 with activity    Time 8    Period Weeks    Status New    Target Date 07/07/21      PT LONG TERM GOAL #3   Title Patient will demonstrate bilateral knee strength grossly 5/5 MMT in order to improve standing and walking tolerance    Baseline bilateral knee pain grossly 4/5 MMT    Time 8    Period Weeks    Status New    Target Date 07/07/21      PT LONG TERM GOAL #4   Title Patient will report no limitation with household mobility using [place] in order to indicate independence at home    Baseline patient requires manual assst for ambulation at eval    Time 8    Period Weeks    Status New    Target Date 07/07/21      PT LONG TERM GOAL #5   Title Establish goal for balance measure (  5xSTS or TUG)                   Plan - 06/04/21 1712     Clinical Impression Statement Patient will  fair tolerance for treatment but with no adverse effects. He was able to perform NuStep this visit to improve LE strength and endurance. Continued to work on LE strengthening and stretching to improve mobility. He was able to ambulate 184f with walking stick in RUE and hand hold assist on LUE. His vision and hearing impairment continues to be a major limiting factor in therapy but he is able to complete the exercises with extra time. He continues to exhibit gross strength deficits of bilateral hips and LEs. No change to HEP this visit. He would benefit from continued skilled PT to progress his strength and walking ability in order to improve independence and maximize functional ability with household mobility.    PT Treatment/Interventions ADLs/Self Care Home Management;Aquatic Therapy;Cryotherapy;Electrical Stimulation;Iontophoresis 454mml Dexamethasone;Moist Heat;Traction;Ultrasound;Gait training;Stair training;Functional mobility training;Therapeutic activities;Therapeutic exercise;Balance training;Neuromuscular re-education;Manual techniques;Patient/family education;Dry needling;Passive range of motion;Taping;Vasopneumatic Device;Spinal Manipulations;Joint Manipulations    PT Next Visit Plan Review HEP and progress PRN, NuStep, continue BLE strengthening with progression to standing as able, balance and gait training in // bars or [place]    PT [place] Agree with Plan of Care Patient;Family member/caregiver    Family Member Consulted Son             Patient will benefit from skilled therapeutic intervention in order to improve the following deficits and impairments:  Abnormal gait, Decreased range of motion, Difficulty walking, Pain, Decreased activity tolerance, Postural dysfunction, Decreased strength, Decreased balance, Impaired flexibility  Visit Diagnosis: Chronic pain of left knee  Chronic pain of right knee  Muscle weakness (generalized)  Other  abnormalities of gait and mobility     Problem List Patient Active Problem List   Diagnosis Date Noted   Poor appetite 04/29/2021   Generalized abdominal pain 04/29/2021   Irritable bowel syndrome 04/29/2021   Language barrier 12/09/2016   Bacterial infection due to H. pylori 12/09/2016   Abdominal tenderness, left lower quadrant 11/05/2016   Gastroesophageal reflux disease 11/05/2016   Hemorrhage of rectum and anus 11/05/2016   Essential hypertension 08/25/2016   Benign prostatic hyperplasia with lower urinary tract symptoms 08/25/2016   Chronic midline low back pain 08/25/2016    CaHilda BladesPT, DPT, LAT, ATC 06/04/21  5:29 PM Phone: [phone]: [id number]: [phone] Fax:  [phone]: James JeppsenRN: [id number] of Birth: [birth year] PHYSICAL THERAPY DISCHARGE SUMMARY  Visits from Start of Care: 4  Current functional level related to goals / functional outcomes: See above   Remaining deficits: See above   Education / Equipment: HEP   Patient agrees to discharge. Patient goals were partially met. Patient is being discharged due to not returning since the last visit.

## 2021-06-06 ENCOUNTER — Other Ambulatory Visit: Payer: Self-pay

## 2021-06-09 ENCOUNTER — Ambulatory Visit: Payer: Medicaid Other | Admitting: Physical Therapy

## 2021-06-11 ENCOUNTER — Ambulatory Visit: Payer: Medicaid Other | Attending: Physician Assistant

## 2021-06-20 ENCOUNTER — Encounter: Payer: Self-pay | Admitting: Nurse Practitioner

## 2021-06-20 ENCOUNTER — Ambulatory Visit: Payer: Medicaid Other | Attending: Nurse Practitioner | Admitting: Nurse Practitioner

## 2021-06-20 ENCOUNTER — Other Ambulatory Visit: Payer: Self-pay

## 2021-06-20 VITALS — BP 167/93 | HR 69 | Ht 74.0 in | Wt 177.5 lb

## 2021-06-20 DIAGNOSIS — I10 Essential (primary) hypertension: Secondary | ICD-10-CM

## 2021-06-20 DIAGNOSIS — I739 Peripheral vascular disease, unspecified: Secondary | ICD-10-CM | POA: Diagnosis not present

## 2021-06-20 DIAGNOSIS — N401 Enlarged prostate with lower urinary tract symptoms: Secondary | ICD-10-CM

## 2021-06-20 DIAGNOSIS — N138 Other obstructive and reflux uropathy: Secondary | ICD-10-CM

## 2021-06-20 DIAGNOSIS — Z23 Encounter for immunization: Secondary | ICD-10-CM

## 2021-06-20 DIAGNOSIS — D72829 Elevated white blood cell count, unspecified: Secondary | ICD-10-CM | POA: Diagnosis not present

## 2021-06-20 DIAGNOSIS — K219 Gastro-esophageal reflux disease without esophagitis: Secondary | ICD-10-CM | POA: Diagnosis not present

## 2021-06-20 DIAGNOSIS — R7303 Prediabetes: Secondary | ICD-10-CM

## 2021-06-20 MED ORDER — OMEPRAZOLE 40 MG PO CPDR
40.0000 mg | DELAYED_RELEASE_CAPSULE | Freq: Two times a day (BID) | ORAL | 1 refills | Status: AC
  Filled 2021-06-20: qty 180, 90d supply, fill #0

## 2021-06-20 MED ORDER — LISINOPRIL 10 MG PO TABS
10.0000 mg | ORAL_TABLET | Freq: Every day | ORAL | 2 refills | Status: AC
  Filled 2021-06-20: qty 90, 90d supply, fill #0

## 2021-06-20 MED ORDER — AMLODIPINE BESYLATE 5 MG PO TABS
5.0000 mg | ORAL_TABLET | Freq: Every day | ORAL | 3 refills | Status: AC
  Filled 2021-06-20: qty 90, 90d supply, fill #0

## 2021-06-20 MED ORDER — FINASTERIDE 5 MG PO TABS
5.0000 mg | ORAL_TABLET | Freq: Every day | ORAL | 2 refills | Status: AC
  Filled 2021-06-20: qty 90, 90d supply, fill #0

## 2021-06-20 MED ORDER — DOXAZOSIN MESYLATE 4 MG PO TABS
4.0000 mg | ORAL_TABLET | Freq: Every day | ORAL | 2 refills | Status: AC
  Filled 2021-06-20: qty 90, 90d supply, fill #0

## 2021-06-20 NOTE — Progress Notes (Signed)
Assessment & Plan:  [name] was seen today for hypertension.  Diagnoses and all orders for this visit:  Essential hypertension -     CMP14+EGFR -     lisinopril (ZESTRIL) 10 MG tablet; Take 1 tablet (10 mg total) by mouth daily. -     amLODipine (NORVASC) 5 MG tablet; Take 1 tablet (5 mg total) by mouth daily.  Claudication of right lower extremity (HCC) -     CBC with Differential -     POCT ABI Screening for Pilot No Charge -     Ambulatory referral to Vascular Surgery  Leukocytosis, unspecified type -     CBC with Differential  Gastroesophageal reflux disease without esophagitis -     omeprazole (PRILOSEC) 40 MG capsule; Take 1 capsule (40 mg total) by mouth in the morning and at bedtime.  Prediabetes -     Hemoglobin A1c  BPH with obstruction/lower urinary tract symptoms -     doxazosin (CARDURA) 4 MG tablet; Take 1 tablet (4 mg total) by mouth at bedtime. -     finasteride (PROSCAR) 5 MG tablet; Take 1 tablet (5 mg total) by mouth at bedtime.   Patient has been counseled on age-appropriate routine health concerns for screening and prevention. These are reviewed and up-to-date. Referrals have been placed accordingly. Immunizations are up-to-date or declined.    Subjective:   Chief Complaint  Patient presents with   Hypertension   HPI James Ayers 87 y.o. male presents to office today FOR FOLLOW UP TO HTN He is accompanied by his daughter in law as he is HOH and does not speak English  HX of HTN, GERD, BPH, chronic back pain   HTN Blood pressure is not well controlled. He does not take his medications as prescribed: lisinopril 10 mg daily, amlodipine 5 g daily.  BP Readings from Last 3 Encounters:  06/20/21 (!) 167/93  04/29/21 114/66  03/26/21 (!) 145/78     Claudication: He describes symptoms of numbness, burning, lancinating pain, and tingling of both feet. ABI showing severe PAD on the RLE. Onset of symptoms was  several months ago . Symptoms are  currently of moderate severity. Symptoms occur all day and last hours worse with activity and improves at rest.  He does not have a history of tobacco use.    Review of Systems  Constitutional:  Negative for fever, malaise/fatigue and weight loss.  HENT:  Positive for hearing loss. Negative for nosebleeds.   Eyes:  Positive for blurred vision. Negative for double vision, photophobia, pain, discharge and redness.  Respiratory: Negative.  Negative for cough and shortness of breath.   Cardiovascular:  Positive for claudication. Negative for chest pain, palpitations and leg swelling.  Gastrointestinal:  Positive for heartburn. Negative for nausea and vomiting.  Genitourinary:  Positive for frequency.  Musculoskeletal: Negative.  Negative for myalgias.  Neurological: Negative.  Negative for dizziness, focal weakness, seizures and headaches.  Psychiatric/Behavioral: Negative.  Negative for suicidal ideas.    Past Medical History:  Diagnosis Date   BPH (benign prostatic hyperplasia)    GERD (gastroesophageal reflux disease)    Glaucoma    Hearing loss    Hypertension    Osteoarthritis    both knee   Vitamin D deficiency     Past Surgical History:  Procedure Laterality Date   CATARACT EXTRACTION     COLONOSCOPY WITH PROPOFOL N/A 11/05/2016   Procedure: COLONOSCOPY WITH PROPOFOL;  Surgeon: Wilford Corner, MD;  Location: [facility];  Service:  Endoscopy;  Laterality: N/A;   ESOPHAGOGASTRODUODENOSCOPY (EGD) WITH PROPOFOL N/A 11/05/2016   Procedure: ESOPHAGOGASTRODUODENOSCOPY (EGD) WITH PROPOFOL;  Surgeon: Wilford Corner, MD;  Location: [facility];  Service: Endoscopy;  Laterality: N/A;   EYE SURGERY Bilateral 2007    Family History  Problem Relation Age of Onset   Hypertension Father    Diabetes Neg Hx     Social History Reviewed with no changes to be made today.   Outpatient Medications Prior to Visit  Medication Sig Dispense Refill   acetaminophen (TYLENOL) 325 MG tablet  Take 650 mg by mouth every 6 (six) hours as needed for moderate pain or headache.     atropine 1 % ophthalmic solution Instill 1 drop every day into left eye 5 mL 1   brimonidine (ALPHAGAN) 0.2 % ophthalmic solution Instill 1 drop 2 times every day into right eye 5 mL 5   diclofenac Sodium (VOLTAREN) 1 % GEL Apply 2 g topically 4 (four) times daily as needed. 100 g 0   dorzolamide-timolol (COSOPT) 22.3-6.8 MG/ML ophthalmic solution Instill 1 drop 2 times every day into right eye 5 mL 5   latanoprost (XALATAN) 0.005 % ophthalmic solution Instill 1 drop every day into right eye in the evening 2.5 mL 5   loperamide (IMODIUM) 2 MG capsule Take 1 capsule (2 mg total) by mouth 4 (four) times daily as needed for diarrhea or loose stools. 12 capsule 0   prednisoLONE acetate (PRED FORTE) 1 % ophthalmic suspension Instill 1 drop 4 times every day into left eye 5 mL 1   Travoprost, BAK Free, (TRAVATAN) 0.004 % SOLN ophthalmic solution Place 1 drop into both eyes at bedtime.      amLODipine (NORVASC) 5 MG tablet Take 1 tablet (5 mg total) by mouth daily. 90 tablet 3   doxazosin (CARDURA) 4 MG tablet Take 1 tablet (4 mg total) by mouth at bedtime. 90 tablet 2   finasteride (PROSCAR) 5 MG tablet Take 1 tablet (5 mg total) by mouth at bedtime. 90 tablet 2   aspirin EC 81 MG tablet Take 81 mg by mouth daily. (Patient not taking: Reported on 06/20/2021)     dicyclomine (BENTYL) 10 MG capsule Take 1 capsule (10 mg total) by mouth 3 (three) times daily before meals. (Patient not taking: Reported on 06/20/2021) 90 capsule 5   lisinopril (PRINIVIL,ZESTRIL) 10 MG tablet Take 1 tablet (10 mg total) by mouth daily. (Patient not taking: Reported on 06/20/2021) 90 tablet 2   omeprazole (PRILOSEC) 40 MG capsule Take 1 capsule (40 mg total) by mouth in the morning and at bedtime. 60 capsule 0   No facility-administered medications prior to visit.    Allergies  Allergen Reactions   Penicillins Rash    Has patient had a  PCN reaction causing immediate rash, facial/tongue/throat swelling, SOB or lightheadedness with hypotension: Yes Has patient had a PCN reaction causing severe rash involving mucus membranes or skin necrosis: No Has patient had a PCN reaction that required hospitalization No Has patient had a PCN reaction occurring within the last 10 years: No If all of the above answers are "NO", then may proceed with Cephalosporin use.        Objective:    BP (!) 167/93   Pulse 69   Ht 6' 2"  (1.88 m)   Wt 177 lb 8 oz (80.5 kg)   SpO2 97%   BMI 22.79 kg/m  Wt Readings from Last 3 Encounters:  06/20/21 177 lb 8 oz (80.5 kg)  04/29/21 169 lb (76.7 kg)  03/26/21 220 lb (99.8 kg)    Physical Exam Vitals and nursing note reviewed.  Constitutional:      Appearance: He is well-developed.  HENT:     Head: Normocephalic and atraumatic.  Cardiovascular:     Rate and Rhythm: Normal rate and regular rhythm.     Heart sounds: Normal heart sounds. No murmur heard.   No friction rub. No gallop.  Pulmonary:     Effort: Pulmonary effort is normal. No tachypnea or respiratory distress.     Breath sounds: Normal breath sounds. No decreased breath sounds, wheezing, rhonchi or rales.  Chest:     Chest wall: No tenderness.  Abdominal:     General: Bowel sounds are normal.     Palpations: Abdomen is soft.  Musculoskeletal:        General: Normal range of motion.     Cervical back: Normal range of motion.  Skin:    General: Skin is warm and dry.  Neurological:     Mental Status: He is alert and oriented to person, place, and time.     Coordination: Coordination normal.  Psychiatric:        Behavior: Behavior normal. Behavior is cooperative.        Thought Content: Thought content normal.        Judgment: Judgment normal.         Patient has been counseled extensively about nutrition and exercise as well as the importance of adherence with medications and regular follow-up. The patient was given  clear instructions to go to ER or return to medical center if symptoms don't improve, worsen or new problems develop. The patient verbalized understanding.   Follow-up: Return in about 3 months (around 09/20/2021).   Gildardo Pounds, FNP-BC [facility] [place] [place]   06/20/2021, 9:31 PM

## 2021-06-20 NOTE — Patient Instructions (Signed)
Smooth Move for Constipation

## 2021-06-21 LAB — CMP14+EGFR
ALT: 6 IU/L (ref 0–44)
AST: 10 IU/L (ref 0–40)
Albumin/Globulin Ratio: 1.2 (ref 1.2–2.2)
Albumin: 3.7 g/dL (ref 3.6–4.6)
Alkaline Phosphatase: 76 IU/L (ref 44–121)
BUN/Creatinine Ratio: 9 — ABNORMAL LOW (ref 10–24)
BUN: 12 mg/dL (ref 8–27)
Bilirubin Total: 0.9 mg/dL (ref 0.0–1.2)
CO2: 26 mmol/L (ref 20–29)
Calcium: 9 mg/dL (ref 8.6–10.2)
Chloride: 103 mmol/L (ref 96–106)
Creatinine, Ser: 1.34 mg/dL — ABNORMAL HIGH (ref 0.76–1.27)
Globulin, Total: 3.2 g/dL (ref 1.5–4.5)
Glucose: 88 mg/dL (ref 70–99)
Potassium: 4 mmol/L (ref 3.5–5.2)
Sodium: 142 mmol/L (ref 134–144)
Total Protein: 6.9 g/dL (ref 6.0–8.5)
eGFR: 52 mL/min/{1.73_m2} — ABNORMAL LOW (ref >59)

## 2021-06-21 LAB — CBC WITH DIFFERENTIAL/PLATELET
Basophils Absolute: 0 10*3/uL (ref 0.0–0.2)
Basos: 1 %
EOS (ABSOLUTE): 0.3 10*3/uL (ref 0.0–0.4)
Eos: 3 %
Hematocrit: 40.9 % (ref 37.5–51.0)
Hemoglobin: 13.8 g/dL (ref 13.0–17.7)
Immature Grans (Abs): 0 10*3/uL (ref 0.0–0.1)
Immature Granulocytes: 0 %
Lymphocytes Absolute: 2.4 10*3/uL (ref 0.7–3.1)
Lymphs: 29 %
MCH: 30.1 pg (ref 26.6–33.0)
MCHC: 33.7 g/dL (ref 31.5–35.7)
MCV: 89 fL (ref 79–97)
Monocytes Absolute: 0.9 10*3/uL (ref 0.1–0.9)
Monocytes: 11 %
Neutrophils Absolute: 4.8 10*3/uL (ref 1.4–7.0)
Neutrophils: 56 %
Platelets: 217 10*3/uL (ref 150–450)
RBC: 4.58 x10E6/uL (ref 4.14–5.80)
RDW: 13.4 % (ref 11.6–15.4)
WBC: 8.5 10*3/uL (ref 3.4–10.8)

## 2021-06-21 LAB — HEMOGLOBIN A1C
Est. average glucose Bld gHb Est-mCnc: 111 mg/dL
Hgb A1c MFr Bld: 5.5 % (ref 4.8–5.6)

## 2021-06-23 ENCOUNTER — Other Ambulatory Visit: Payer: Self-pay

## 2021-06-27 ENCOUNTER — Other Ambulatory Visit: Payer: Self-pay

## 2021-06-29 ENCOUNTER — Other Ambulatory Visit: Payer: Self-pay

## 2021-06-29 DIAGNOSIS — I739 Peripheral vascular disease, unspecified: Secondary | ICD-10-CM

## 2021-06-29 DIAGNOSIS — K551 Chronic vascular disorders of intestine: Secondary | ICD-10-CM

## 2021-06-30 ENCOUNTER — Other Ambulatory Visit: Payer: Self-pay

## 2021-07-14 ENCOUNTER — Other Ambulatory Visit: Payer: Self-pay

## 2021-07-15 ENCOUNTER — Other Ambulatory Visit: Payer: Self-pay

## 2021-07-15 MED ORDER — PREDNISOLONE ACETATE 1 % OP SUSP
OPHTHALMIC | 3 refills | Status: AC
  Filled 2021-07-15: qty 5, 20d supply, fill #0

## 2021-07-16 ENCOUNTER — Other Ambulatory Visit: Payer: Self-pay

## 2021-07-28 ENCOUNTER — Ambulatory Visit (HOSPITAL_COMMUNITY): Payer: Medicaid Other | Attending: Physician Assistant

## 2021-07-28 ENCOUNTER — Ambulatory Visit: Payer: Medicaid Other

## 2021-09-22 ENCOUNTER — Ambulatory Visit: Payer: Medicaid Other | Admitting: Nurse Practitioner

## 2021-10-28 NOTE — Progress Notes (Deleted)
[facility] [place] ?HISTORY AND PHYSICAL  ? ?History of Present Illness:  Patient is a 88 y.o. year old male who presents for evaluation of claudication.  The patient currently describes a cramping sensation in the {Desc; right/left/both:390000001} lower extremity. There {Is/is not:9024} rest pain.  There is no history of ulcerations on the feet.  Atherosclerotic risk factors and other medical problems include *** ? ?Past Medical History:  ?Diagnosis Date  ? BPH (benign prostatic hyperplasia)   ? GERD (gastroesophageal reflux disease)   ? Glaucoma   ? Hearing loss   ? Hypertension   ? Osteoarthritis   ? both knee  ? Vitamin D deficiency   ? ? ?Past Surgical History:  ?Procedure Laterality Date  ? CATARACT EXTRACTION    ? COLONOSCOPY WITH PROPOFOL N/A 11/05/2016  ? Procedure: COLONOSCOPY WITH PROPOFOL;  Surgeon: Wilford Corner, MD;  Location: [facility];  Service: Endoscopy;  Laterality: N/A;  ? ESOPHAGOGASTRODUODENOSCOPY (EGD) WITH PROPOFOL N/A 11/05/2016  ? Procedure: ESOPHAGOGASTRODUODENOSCOPY (EGD) WITH PROPOFOL;  Surgeon: Wilford Corner, MD;  Location: [facility];  Service: Endoscopy;  Laterality: N/A;  ? EYE SURGERY Bilateral 2007  ? ? ?ROS:  ? ?General:  No weight loss, Fever, chills ? ?HEENT: No recent headaches, no nasal bleeding, no visual changes, no sore throat ? ?Neurologic: No dizziness, blackouts, seizures. No recent symptoms of stroke or mini- stroke. No recent episodes of slurred speech, or temporary blindness. ? ?Cardiac: No recent episodes of chest pain/pressure, no shortness of breath at rest.  No shortness of breath with exertion.  Denies history of atrial fibrillation or irregular heartbeat ? ?Vascular: No history of rest pain in feet.  No history of claudication.  No history of non-healing ulcer, No history of DVT  ? ?Pulmonary: No home oxygen, no productive cough, no hemoptysis,  No asthma or wheezing ? ?Musculoskeletal:  '[ ]'$  Arthritis, '[ ]'$  Low back pain,  '[ ]'$   Joint pain ? ?Hematologic:No history of hypercoagulable state.  No history of easy bleeding.  No history of anemia ? ?Gastrointestinal: No hematochezia or melena,  No gastroesophageal reflux, no trouble swallowing ? ?Urinary: '[ ]'$  chronic Kidney disease, '[ ]'$  on HD - '[ ]'$  MWF or '[ ]'$  TTHS, '[ ]'$  Burning with urination, '[ ]'$  Frequent urination, '[ ]'$  Difficulty urinating;  ? ?Skin: No rashes ? ?Psychological: No history of anxiety,  No history of depression ? ?Social History ?Social History  ? ?Tobacco Use  ? Smoking status: Never  ? Smokeless tobacco: Never  ?Vaping Use  ? Vaping Use: Never used  ?Substance Use Topics  ? Alcohol use: No  ? Drug use: No  ? ? ?Family History ?Family History  ?Problem Relation Age of Onset  ? Hypertension Father   ? Diabetes Neg Hx   ? ? ?Allergies ? ?Allergies  ?Allergen Reactions  ? Penicillins Rash  ?  Has patient had a PCN reaction causing immediate rash, facial/tongue/throat swelling, SOB or lightheadedness with hypotension: Yes ?Has patient had a PCN reaction causing severe rash involving mucus membranes or skin necrosis: No ?Has patient had a PCN reaction that required hospitalization No ?Has patient had a PCN reaction occurring within the last 10 years: No ?If all of the above answers are "NO", then may proceed with Cephalosporin use. ?  ? ? ? ?Current Outpatient Medications  ?Medication Sig Dispense Refill  ? acetaminophen (TYLENOL) 325 MG tablet Take 650 mg by mouth every 6 (six) hours as needed for moderate pain or headache.    ?  amLODipine (NORVASC) 5 MG tablet Take 1 tablet (5 mg total) by mouth daily. 90 tablet 3  ? aspirin EC 81 MG tablet Take 81 mg by mouth daily. (Patient not taking: Reported on 06/20/2021)    ? atropine 1 % ophthalmic solution Instill 1 drop every day into left eye 5 mL 1  ? brimonidine (ALPHAGAN) 0.2 % ophthalmic solution Instill 1 drop 2 times every day into right eye 5 mL 5  ? diclofenac Sodium (VOLTAREN) 1 % GEL Apply 2 g topically 4 (four) times daily as  needed. 100 g 0  ? dicyclomine (BENTYL) 10 MG capsule Take 1 capsule (10 mg total) by mouth 3 (three) times daily before meals. (Patient not taking: Reported on 06/20/2021) 90 capsule 5  ? dorzolamide-timolol (COSOPT) 22.3-6.8 MG/ML ophthalmic solution Instill 1 drop 2 times every day into right eye 5 mL 5  ? doxazosin (CARDURA) 4 MG tablet Take 1 tablet (4 mg total) by mouth at bedtime. 90 tablet 2  ? finasteride (PROSCAR) 5 MG tablet Take 1 tablet (5 mg total) by mouth at bedtime. 90 tablet 2  ? latanoprost (XALATAN) 0.005 % ophthalmic solution Instill 1 drop every day into right eye in the evening 2.5 mL 5  ? lisinopril (ZESTRIL) 10 MG tablet Take 1 tablet (10 mg total) by mouth daily. 90 tablet 2  ? loperamide (IMODIUM) 2 MG capsule Take 1 capsule (2 mg total) by mouth 4 (four) times daily as needed for diarrhea or loose stools. 12 capsule 0  ? omeprazole (PRILOSEC) 40 MG capsule Take 1 capsule (40 mg total) by mouth in the morning and at bedtime. 180 capsule 1  ? prednisoLONE acetate (PRED FORTE) 1 % ophthalmic suspension Instill 1 drop 4 times every day into left eye 5 mL 3  ? Travoprost, BAK Free, (TRAVATAN) 0.004 % SOLN ophthalmic solution Place 1 drop into both eyes at bedtime.     ? ?No current facility-administered medications for this visit.  ? ? ?Physical Examination ? ?There were no vitals filed for this visit. ? ?There is no height or weight on file to calculate BMI. ? ?General:  Alert and oriented, no acute distress ?HEENT: Normal ?Neck: No bruit or JVD ?Pulmonary: Clear to auscultation bilaterally ?Cardiac: Regular Rate and Rhythm without murmur ?Abdomen: Soft, non-tender, non-distended, no mass, no scars ?Skin: No rash ?Extremity Pulses:  2+ radial, brachial, femoral, dorsalis pedis, posterior tibial pulses bilaterally ?Musculoskeletal: No deformity or edema  ?Neurologic: Upper and lower extremity motor 5/5 and symmetric ? ?DATA: *** ? ? ?ASSESSMENT: *** ? ? ?PLAN: *** ? ?Roxy Horseman ?PA-C ?[facility] [place] ?Office: [phone] ? ?MD in clinic [place] ?

## 2021-10-29 ENCOUNTER — Ambulatory Visit: Payer: Medicaid Other

## 2021-10-29 ENCOUNTER — Encounter (HOSPITAL_COMMUNITY): Payer: Medicaid Other
